# Patient Record
Sex: Male | Born: 1960 | Race: Black or African American | Hispanic: No | Marital: Married | Smoking: Never smoker
Health system: Southern US, Community
[De-identification: ages and names within clinical notes are randomized; demographics above are authoritative.]

## PROBLEM LIST (undated history)

## (undated) DIAGNOSIS — E559 Vitamin D deficiency, unspecified: Secondary | ICD-10-CM

## (undated) DIAGNOSIS — F32A Depression, unspecified: Secondary | ICD-10-CM

## (undated) DIAGNOSIS — M2550 Pain in unspecified joint: Secondary | ICD-10-CM

## (undated) DIAGNOSIS — E785 Hyperlipidemia, unspecified: Secondary | ICD-10-CM

## (undated) DIAGNOSIS — R12 Heartburn: Secondary | ICD-10-CM

## (undated) DIAGNOSIS — E119 Type 2 diabetes mellitus without complications: Secondary | ICD-10-CM

## (undated) DIAGNOSIS — K5900 Constipation, unspecified: Secondary | ICD-10-CM

## (undated) DIAGNOSIS — I10 Essential (primary) hypertension: Secondary | ICD-10-CM

## (undated) DIAGNOSIS — M549 Dorsalgia, unspecified: Secondary | ICD-10-CM

## (undated) DIAGNOSIS — K219 Gastro-esophageal reflux disease without esophagitis: Secondary | ICD-10-CM

## (undated) DIAGNOSIS — G4733 Obstructive sleep apnea (adult) (pediatric): Secondary | ICD-10-CM

## (undated) DIAGNOSIS — C61 Malignant neoplasm of prostate: Secondary | ICD-10-CM

## (undated) HISTORY — DX: Heartburn: R12

## (undated) HISTORY — DX: Hyperlipidemia, unspecified: E78.5

## (undated) HISTORY — DX: Type 2 diabetes mellitus without complications: E11.9

## (undated) HISTORY — DX: Vitamin D deficiency, unspecified: E55.9

## (undated) HISTORY — DX: Obstructive sleep apnea (adult) (pediatric): G47.33

## (undated) HISTORY — DX: Depression, unspecified: F32.A

## (undated) HISTORY — DX: Constipation, unspecified: K59.00

## (undated) HISTORY — PX: PROSTATE SURGERY: SHX751

## (undated) HISTORY — DX: Dorsalgia, unspecified: M54.9

## (undated) HISTORY — DX: Pain in unspecified joint: M25.50

---

## 1998-08-18 ENCOUNTER — Emergency Department (HOSPITAL_COMMUNITY): Admission: EM | Admit: 1998-08-18 | Discharge: 1998-08-18 | Payer: Self-pay | Admitting: Emergency Medicine

## 1998-08-18 ENCOUNTER — Encounter: Payer: Self-pay | Admitting: Emergency Medicine

## 2000-02-16 ENCOUNTER — Emergency Department (HOSPITAL_COMMUNITY): Admission: EM | Admit: 2000-02-16 | Discharge: 2000-02-16 | Payer: Self-pay | Admitting: Emergency Medicine

## 2003-05-16 ENCOUNTER — Emergency Department (HOSPITAL_COMMUNITY): Admission: EM | Admit: 2003-05-16 | Discharge: 2003-05-16 | Payer: Self-pay | Admitting: Emergency Medicine

## 2005-08-13 ENCOUNTER — Emergency Department (HOSPITAL_COMMUNITY): Admission: EM | Admit: 2005-08-13 | Discharge: 2005-08-13 | Payer: Self-pay | Admitting: Emergency Medicine

## 2009-01-02 ENCOUNTER — Emergency Department (HOSPITAL_COMMUNITY): Admission: EM | Admit: 2009-01-02 | Discharge: 2009-01-02 | Payer: Self-pay | Admitting: Emergency Medicine

## 2010-07-03 LAB — POCT I-STAT, CHEM 8
BUN: 15 mg/dL (ref 6–23)
Calcium, Ion: 1.15 mmol/L (ref 1.12–1.32)
Chloride: 105 mEq/L (ref 96–112)
Creatinine, Ser: 1.1 mg/dL (ref 0.4–1.5)
Glucose, Bld: 99 mg/dL (ref 70–99)
HCT: 35 % — ABNORMAL LOW (ref 39.0–52.0)
Hemoglobin: 11.9 g/dL — ABNORMAL LOW (ref 13.0–17.0)
Potassium: 4.6 mEq/L (ref 3.5–5.1)
Sodium: 139 mEq/L (ref 135–145)
TCO2: 24 mmol/L (ref 0–100)

## 2011-07-12 ENCOUNTER — Emergency Department (HOSPITAL_COMMUNITY): Payer: BC Managed Care – PPO

## 2011-07-12 ENCOUNTER — Encounter (HOSPITAL_COMMUNITY): Payer: Self-pay | Admitting: *Deleted

## 2011-07-12 ENCOUNTER — Emergency Department (HOSPITAL_COMMUNITY)
Admission: EM | Admit: 2011-07-12 | Discharge: 2011-07-13 | Disposition: A | Discharge status: Hospice | Payer: BC Managed Care – PPO | Attending: Emergency Medicine | Admitting: Emergency Medicine

## 2011-07-12 DIAGNOSIS — I10 Essential (primary) hypertension: Secondary | ICD-10-CM | POA: Insufficient documentation

## 2011-07-12 DIAGNOSIS — K219 Gastro-esophageal reflux disease without esophagitis: Secondary | ICD-10-CM | POA: Insufficient documentation

## 2011-07-12 DIAGNOSIS — J189 Pneumonia, unspecified organism: Secondary | ICD-10-CM | POA: Insufficient documentation

## 2011-07-12 DIAGNOSIS — R0789 Other chest pain: Secondary | ICD-10-CM | POA: Insufficient documentation

## 2011-07-12 DIAGNOSIS — R091 Pleurisy: Secondary | ICD-10-CM | POA: Insufficient documentation

## 2011-07-12 DIAGNOSIS — R0602 Shortness of breath: Secondary | ICD-10-CM | POA: Insufficient documentation

## 2011-07-12 HISTORY — DX: Essential (primary) hypertension: I10

## 2011-07-12 HISTORY — DX: Gastro-esophageal reflux disease without esophagitis: K21.9

## 2011-07-12 LAB — DIFFERENTIAL
Basophils Absolute: 0 10*3/uL (ref 0.0–0.1)
Basophils Relative: 0 % (ref 0–1)
Eosinophils Relative: 3 % (ref 0–5)
Monocytes Absolute: 0.7 10*3/uL (ref 0.1–1.0)

## 2011-07-12 LAB — CBC
Hemoglobin: 13.8 g/dL (ref 13.0–17.0)
MCH: 28.5 pg (ref 26.0–34.0)
MCHC: 35.5 g/dL (ref 30.0–36.0)
RDW: 13.4 % (ref 11.5–15.5)

## 2011-07-12 LAB — BASIC METABOLIC PANEL
BUN: 13 mg/dL (ref 6–23)
Calcium: 8.5 mg/dL (ref 8.4–10.5)
GFR calc Af Amer: 79 mL/min — ABNORMAL LOW (ref >90)
GFR calc non Af Amer: 69 mL/min — ABNORMAL LOW (ref >90)
Glucose, Bld: 139 mg/dL — ABNORMAL HIGH (ref 70–99)
Potassium: 3.6 mEq/L (ref 3.5–5.1)
Sodium: 136 mEq/L (ref 135–145)

## 2011-07-12 LAB — POCT I-STAT, CHEM 8
Creatinine, Ser: 1.1 mg/dL (ref 0.50–1.35)
Glucose, Bld: 144 mg/dL — ABNORMAL HIGH (ref 70–99)
HCT: 42 % (ref 39.0–52.0)
Hemoglobin: 14.3 g/dL (ref 13.0–17.0)
Sodium: 141 mEq/L (ref 135–145)
TCO2: 25 mmol/L (ref 0–100)

## 2011-07-12 NOTE — ED Notes (Signed)
Pt reports tightness to right chest for a few hours and constant. Pt reports history of the same in previous years and the pain passed. Pt denies N/V or dizziness. Pt reports shortness of breath at times.

## 2011-07-13 ENCOUNTER — Emergency Department (HOSPITAL_COMMUNITY): Payer: BC Managed Care – PPO

## 2011-07-13 MED ORDER — IOHEXOL 300 MG/ML  SOLN
100.0000 mL | Freq: Once | INTRAMUSCULAR | Status: AC | PRN
  Administered 2011-07-13: 100 mL via INTRAVENOUS

## 2011-07-13 MED ORDER — AZITHROMYCIN 250 MG PO TABS
500.0000 mg | ORAL_TABLET | Freq: Once | ORAL | Status: AC
  Administered 2011-07-13: 500 mg via ORAL
  Filled 2011-07-13: qty 2

## 2011-07-13 MED ORDER — IBUPROFEN 800 MG PO TABS: 800.0000 mg | ORAL_TABLET | Freq: Four times a day (QID) | ORAL | Status: AC | PRN

## 2011-07-13 MED ORDER — AZITHROMYCIN 250 MG PO TABS: ORAL_TABLET | ORAL | Status: AC

## 2011-07-13 MED ORDER — IBUPROFEN 800 MG PO TABS
800.0000 mg | ORAL_TABLET | Freq: Once | ORAL | Status: AC
  Administered 2011-07-13: 800 mg via ORAL
  Filled 2011-07-13: qty 1

## 2011-07-13 NOTE — Discharge Instructions (Signed)
Pleurisy Pleurisy is an inflammation and swelling of the lining of the lungs. It usually is the result of an underlying infection or other disease. Because of this inflammation, it hurts to breathe. It is aggravated by coughing or deep breathing. The primary goal in treating pleurisy is to diagnose and treat the condition that caused it.  HOME CARE INSTRUCTIONS   Only take over-the-counter or prescription medicines for pain, discomfort, or fever as directed by your caregiver.   If medications which kill germs (antibiotics) were prescribed, take the entire course. Even if you are feeling better, you need to take them.   Use a cool mist vaporizer to help loosen secretions. This is so the secretions can be coughed up more easily.  SEEK MEDICAL CARE IF:   Your pain is not controlled with medication or is increasing.   You have an increase inpus like (purulent) secretions brought up with coughing.  SEEK IMMEDIATE MEDICAL CARE IF:   You have blue or dark lips, fingernails, or toenails.   You begin coughing up blood.   You have increased difficulty breathing.   You have continuing pain unrelieved by medicine or lasting more than 1 week.   You have pain that radiates into your neck, arms, or jaw.   You develop increased shortness of breath or wheezing.   You develop a fever, rash, vomiting, fainting, or other serious complaints.  Document Released: 03/16/2005 Document Revised: 03/05/2011 Document Reviewed: 10/15/2006 [facility] Patient Information 2012 [place], [place].Pneumonia, Adult Pneumonia is an infection of the lungs.  CAUSES Pneumonia may be caused by bacteria or a virus. Usually, these infections are caused by breathing infectious particles into the lungs (respiratory tract). SYMPTOMS   Cough.   Fever.   Chest pain.   Increased rate of breathing.   Wheezing.   Mucus production.  DIAGNOSIS  If you have the common symptoms of pneumonia, your caregiver will typically  confirm the diagnosis with a chest X-ray. The X-ray will show an abnormality in the lung (pulmonary infiltrate) if you have pneumonia. Other tests of your blood, urine, or sputum may be done to find the specific cause of your pneumonia. Your caregiver may also do tests (blood gases or pulse oximetry) to see how well your lungs are working. TREATMENT  Some forms of pneumonia may be spread to other people when you cough or sneeze. You may be asked to wear a mask before and during your exam. Pneumonia that is caused by bacteria is treated with antibiotic medicine. Pneumonia that is caused by the influenza virus may be treated with an antiviral medicine. Most other viral infections must run their course. These infections will not respond to antibiotics.  PREVENTION A pneumococcal shot (vaccine) is available to prevent a common bacterial cause of pneumonia. This is usually suggested for:  People over 79 years old.   Patients on chemotherapy.   People with chronic lung problems, such as bronchitis or emphysema.   People with immune system problems.  If you are over 65 or have a high risk condition, you may receive the pneumococcal vaccine if you have not received it before. In some countries, a routine influenza vaccine is also recommended. This vaccine can help prevent some cases of pneumonia.You may be offered the influenza vaccine as part of your care. If you smoke, it is time to quit. You may receive instructions on how to stop smoking. Your caregiver can provide medicines and counseling to help you quit. HOME CARE INSTRUCTIONS   Cough suppressants  may be used if you are losing too much rest. However, coughing protects you by clearing your lungs. You should avoid using cough suppressants if you can.   Your caregiver may have prescribed medicine if he or she thinks your pneumonia is caused by a bacteria or influenza. Finish your medicine even if you start to feel better.   Your caregiver may also  prescribe an expectorant. This loosens the mucus to be coughed up.   Only take over-the-counter or prescription medicines for pain, discomfort, or fever as directed by your caregiver.   Do not smoke. Smoking is a common cause of bronchitis and can contribute to pneumonia. If you are a smoker and continue to smoke, your cough may last several weeks after your pneumonia has cleared.   A cold steam vaporizer or humidifier in your room or home may help loosen mucus.   Coughing is often worse at night. Sleeping in a semi-upright position in a recliner or using a couple pillows under your head will help with this.   Get rest as you feel it is needed. Your body will usually let you know when you need to rest.  SEEK IMMEDIATE MEDICAL CARE IF:   Your illness becomes worse. This is especially true if you are elderly or weakened from any other disease.   You cannot control your cough with suppressants and are losing sleep.   You begin coughing up blood.   You develop pain which is getting worse or is uncontrolled with medicines.   You have a fever.   Any of the symptoms which initially brought you in for treatment are getting worse rather than better.   You develop shortness of breath or chest pain.  MAKE SURE YOU:   Understand these instructions.   Will watch your condition.   Will get help right away if you are not doing well or get worse.  Document Released: 03/16/2005 Document Revised: 03/05/2011 Document Reviewed: 06/05/2010 [facility] Patient Information 2012 [place], [place].

## 2011-07-13 NOTE — ED Provider Notes (Signed)
History     CSN: [id number]  Arrival date & time 07/12/11  2152   First MD Initiated Contact with Patient 07/12/11 2234      Chief Complaint  Patient presents with  . Chest Pain  . Shortness of Breath    (Consider location/radiation/quality/duration/timing/severity/associated sxs/prior treatment) HPI Comments: Patient here with acute onset of right anterior chest pain for the past several hours - states that the pain is associated with taking in a deep breath where he feels a catch in the area - deneis fever, chills, cough, congestion, radiation of the pain, nausea, vomiting, shortness of breath, diaphoresis, calf pain or tenderness.  No history of clotting disorder and no cardiac risk factors besides being male and HTN, which is well controlled.  Patient is a 51 y.o. male presenting with chest pain and shortness of breath. The history is provided by the patient. No language interpreter was used.  Chest Pain The chest pain began 3 - 5 hours ago. Chest pain occurs constantly. The chest pain is unchanged. The pain is associated with breathing. At its most intense, the pain is at 5/10. The pain is currently at 5/10. The severity of the pain is moderate. The quality of the pain is described as sharp. The pain does not radiate. Chest pain is worsened by deep breathing. Primary symptoms include shortness of breath. Pertinent negatives for primary symptoms include no fever, no fatigue, no syncope, no cough, no wheezing, no palpitations, no abdominal pain, no nausea, no vomiting, no dizziness and no altered mental status.  Pertinent negatives for associated symptoms include no claudication, no diaphoresis, no lower extremity edema, no near-syncope, no numbness, no orthopnea, no paroxysmal nocturnal dyspnea and no weakness. He tried nothing for the symptoms. Risk factors include male gender.  His past medical history is significant for hypertension.  Pertinent negatives for past medical history include  no CAD, no diabetes and no hyperlipidemia.    Shortness of Breath  Associated symptoms include chest pain and shortness of breath. Pertinent negatives include no orthopnea, no fever, no cough and no wheezing.    Past Medical History  Diagnosis Date  . Hypertension   . Acid reflux     History reviewed. No pertinent past surgical history.  No family history on file.  History  Substance Use Topics  . Smoking status: Never Smoker   . Smokeless tobacco: Not on file  . Alcohol Use: No      Review of Systems  Constitutional: Negative for fever, diaphoresis and fatigue.  Respiratory: Positive for shortness of breath. Negative for cough and wheezing.   Cardiovascular: Positive for chest pain. Negative for palpitations, orthopnea, claudication, syncope and near-syncope.  Gastrointestinal: Negative for nausea, vomiting and abdominal pain.  Neurological: Negative for dizziness, weakness and numbness.  Psychiatric/Behavioral: Negative for altered mental status.  All other systems reviewed and are negative.    Allergies  Review of patient's allergies indicates no known allergies.  Home Medications   Current Outpatient Rx  Name Route Sig Dispense Refill  . DEXLANSOPRAZOLE 60 MG PO CPDR Oral Take 60 mg by mouth daily.    Marland Kitchen VALSARTAN 320 MG PO TABS Oral Take 320 mg by mouth daily.      BP 136/77  Pulse 69  Temp(Src) 98 F (36.7 C) (Oral)  Resp 16  Ht 5\' 7"  (1.702 m)  Wt 230 lb (104.327 kg)  BMI 36.02 kg/m2  SpO2 98%  Physical Exam  Nursing note and vitals reviewed. Constitutional: He is oriented  to person, place, and time. He appears well-developed and well-nourished. No distress.  HENT:  Head: Normocephalic and atraumatic.  Right Ear: External ear normal.  Left Ear: External ear normal.  Nose: Nose normal.  Mouth/Throat: Oropharynx is clear and moist. No oropharyngeal exudate.  Eyes: Conjunctivae are normal. Pupils are equal, round, and reactive to light. No scleral  icterus.  Neck: Normal range of motion. Neck supple. No JVD present. No tracheal deviation present.  Cardiovascular: Normal rate, regular rhythm and normal heart sounds.  Exam reveals no gallop and no friction rub.   No murmur heard. Pulmonary/Chest: Effort normal and breath sounds normal. No respiratory distress. He has no wheezes. He has no rales. He exhibits no tenderness.  Abdominal: Soft. Bowel sounds are normal. He exhibits no distension. There is no tenderness.  Musculoskeletal: Normal range of motion. He exhibits no edema and no tenderness.  Lymphadenopathy:    He has no cervical adenopathy.  Neurological: He is alert and oriented to person, place, and time. No cranial nerve deficit.  Skin: Skin is warm and dry. No rash noted. No erythema. No pallor.  Psychiatric: He has a normal mood and affect. His behavior is normal. Judgment and thought content normal.    ED Course  Procedures (including critical care time)  Labs Reviewed  CBC - Abnormal; Notable for the following:    HCT 38.9 (*)    All other components within normal limits  BASIC METABOLIC PANEL - Abnormal; Notable for the following:    Glucose, Bld 139 (*)    GFR calc non Af Amer 69 (*)    GFR calc Af Amer 79 (*)    All other components within normal limits  D-DIMER, QUANTITATIVE - Abnormal; Notable for the following:    D-Dimer, Quant 0.64 (*)    All other components within normal limits  POCT I-STAT, CHEM 8 - Abnormal; Notable for the following:    Glucose, Bld 144 (*)    All other components within normal limits  DIFFERENTIAL   Dg Chest 2 View  07/12/2011  *RADIOLOGY REPORT*  Clinical Data: Chest pain.  CHEST - 2 VIEW  Comparison: None.  Findings: The lungs are well-aerated.  Mild left basilar opacity may reflect atelectasis or pneumonia, though not well characterized on the lateral view.  There is no evidence of pleural effusion or pneumothorax.  The heart is normal in size; the mediastinal contour is within  normal limits.  No acute osseous abnormalities are seen.  IMPRESSION: Mild left basilar airspace opacity may reflect atelectasis or pneumonia.  Original Report Authenticated By: Tonia Ghent, M.D.   Ct Angio Chest W/cm &/or Wo Cm  07/13/2011  *RADIOLOGY REPORT*  Clinical Data: Right-sided chest tightness; shortness of breath. Elevated D-dimer.  CT ANGIOGRAPHY CHEST  Technique:  Multidetector CT imaging of the chest using the standard protocol during bolus administration of intravenous contrast. Multiplanar reconstructed images including MIPs were obtained and reviewed to evaluate the vascular anatomy.  Contrast: OMNIPAQUE IOHEXOL 300 MG/ML  SOLN  Comparison: Chest radiograph performed 07/12/2011  Findings: There is no evidence of central pulmonary embolus. Evaluation for pulmonary embolus is suboptimal due to limitations in the timing of the contrast bolus  A trace right-sided pleural effusion is noted.  Mild bilateral dependent subsegmental atelectasis is noted.  There is no evidence of significant focal consolidation or pneumothorax.  No masses are identified; no abnormal focal contrast enhancement is seen.  The mediastinum is unremarkable in appearance.  No mediastinal lymphadenopathy is seen.  No pericardial effusion is identified. The great vessels are unremarkable in appearance.  Incidental note is made of a direct origin of the left vertebral artery from the aortic arch.  No axillary lymphadenopathy is seen.  The visualized portions of the thyroid gland are unremarkable in appearance.  The visualized portions of the liver and spleen are unremarkable. The visualized portions of the pancreas, gallbladder, stomach and adrenal glands are within normal limits.  A fluid-filled structure inferior to the left hepatic lobe is thought to reflect the antrum of the stomach.  No acute osseous abnormalities are seen.  An apparent left-sided os acromiale is noted.  IMPRESSION:  1.  No evidence of central pulmonary  embolus. 2.  Trace right-sided pleural effusion, and mild bilateral dependent subsegmental atelectasis noted; lungs otherwise clear. 3.  Apparent left-sided os acromiale noted.  Original Report Authenticated By: Tonia Ghent, M.D.    Date: 07/13/2011  Rate: 72  Rhythm: normal sinus rhythm  QRS Axis: left  Intervals: normal  ST/T Wave abnormalities: normal  Conduction Disutrbances:none  Narrative Interpretation: Reviewed by Dr. Effie Shy  Old EKG Reviewed: unchanged   CAP  Pleurisy    MDM  Patient with right sided pleuritic chest pain with chest xray suspcious to CAP, will treat for such, trace right sided pleural effusion noted on CT scan which may be the cause of the patient's pain.  Will treat with anti-inflammatories and antibiotics.        Izola Price [place], [place] 07/13/11 [phone]

## 2011-07-16 NOTE — ED Provider Notes (Signed)
Medical screening examination/treatment/procedure(s) were performed by non-physician practitioner and as supervising physician I was immediately available for consultation/collaboration.  Kavita Bartl L Kinlie Janice, MD 07/16/11 0726 

## 2013-09-09 IMAGING — CT CT ANGIO CHEST
1 of 2 series · 19 of 32 positions shown · IV contrast (100 ML OMNI 300)
Comparison: Chest radiograph performed 07/12/2011

CLINICAL DATA: Right-sided chest tightness; shortness of breath.
Elevated D-dimer.

CT ANGIOGRAPHY CHEST
TECHNIQUE: Multidetector CT imaging of the chest using the
standard protocol during bolus administration of intravenous
contrast. Multiplanar reconstructed images including MIPs were
obtained and reviewed to evaluate the vascular anatomy.
Contrast: 100mL OMNIPAQUE IOHEXOL 300 MG/ML  SOLN

[Series 7: thins for pacs · axial · 0.74mm/px · z∈[+159,+367]mm · 19 of 232 slices shown]
[im 12/232  lung]
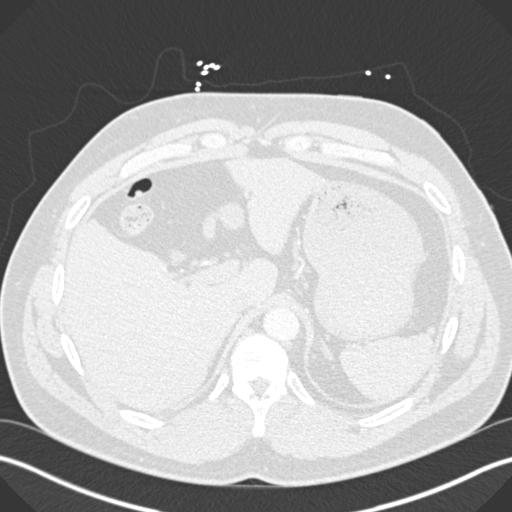
[im 24/232  mediastinal]
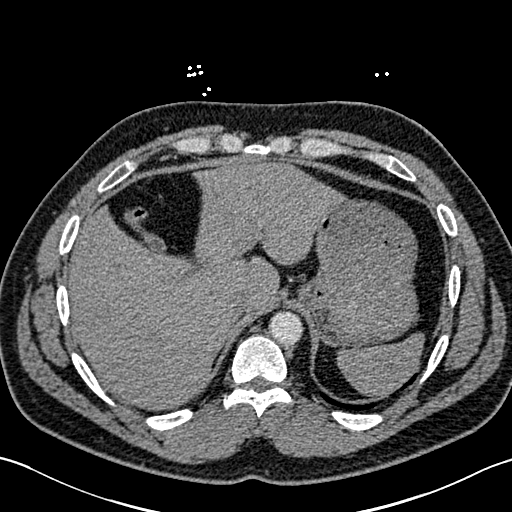
[im 35/232  lung]
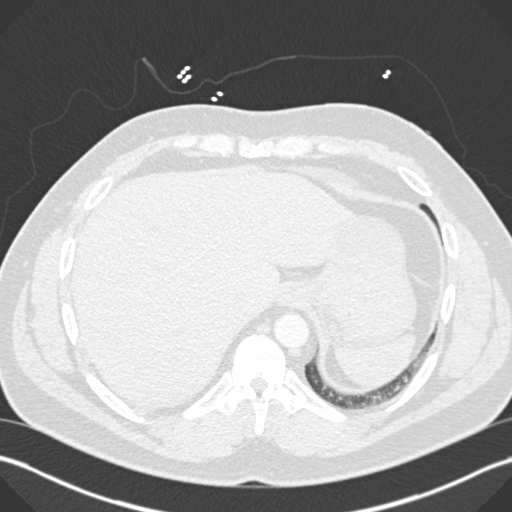
[im 58/232  mediastinal]
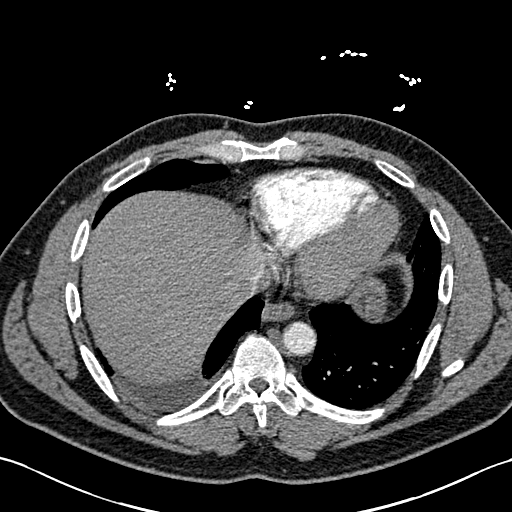
[im 70/232  lung]
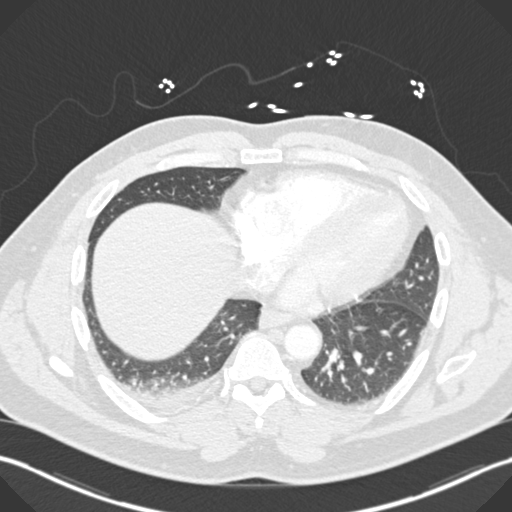
[im 78/232  mediastinal]
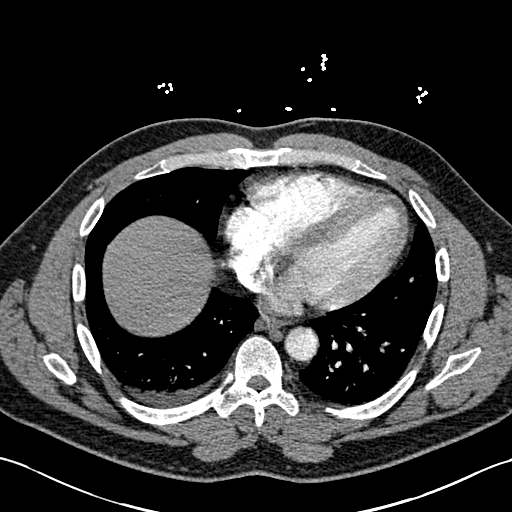
[im 81/232  lung]
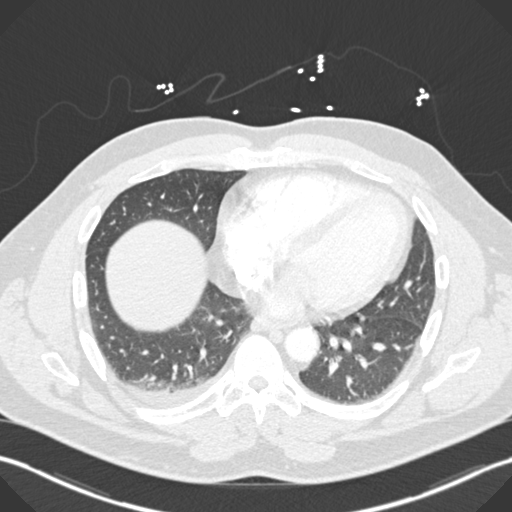
[im 93/232  mediastinal]
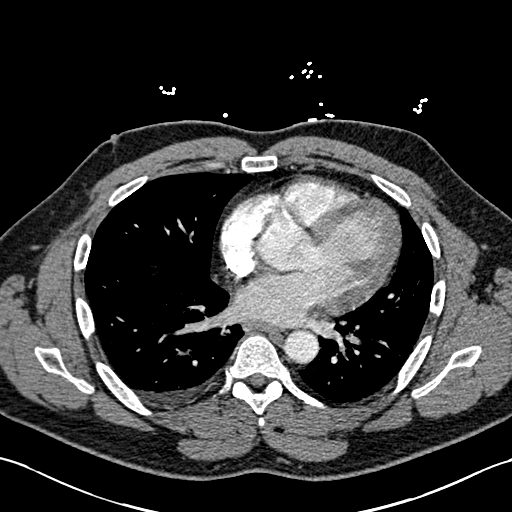
[im 104/232  lung]
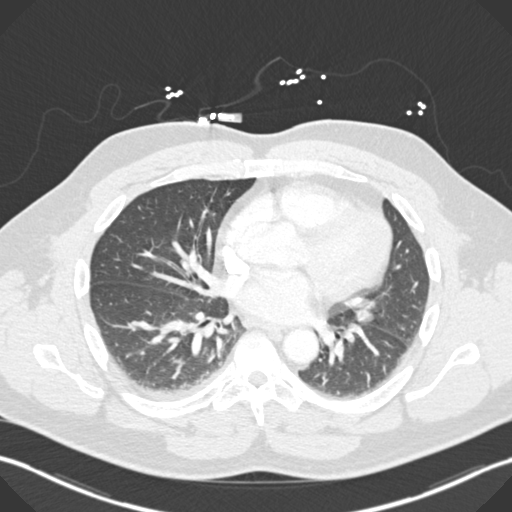
[im 116/232  mediastinal]
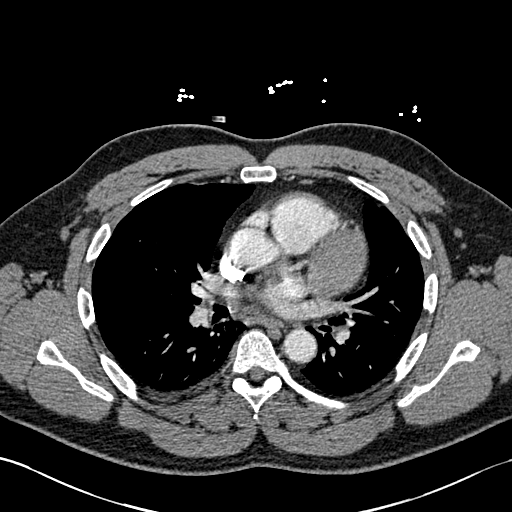
[im 128/232  lung]
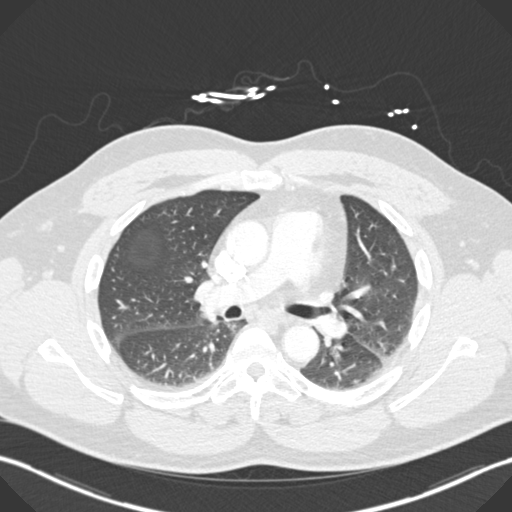
[im 139/232  mediastinal]
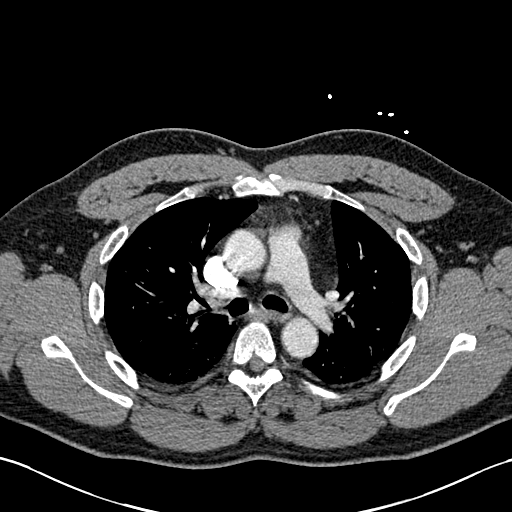
[im 151/232  lung]
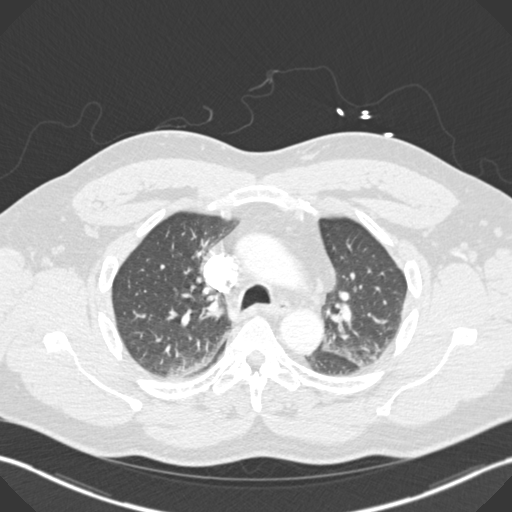
[im 155/232  mediastinal]
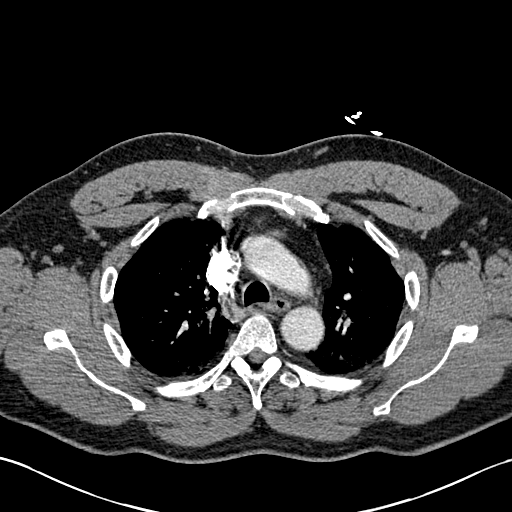
[im 162/232  lung]
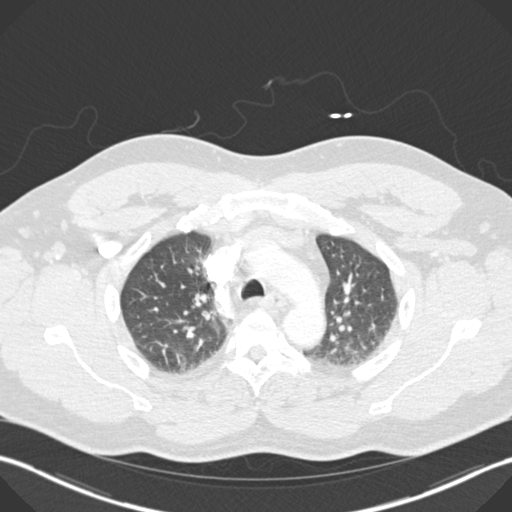
[im 174/232  mediastinal]
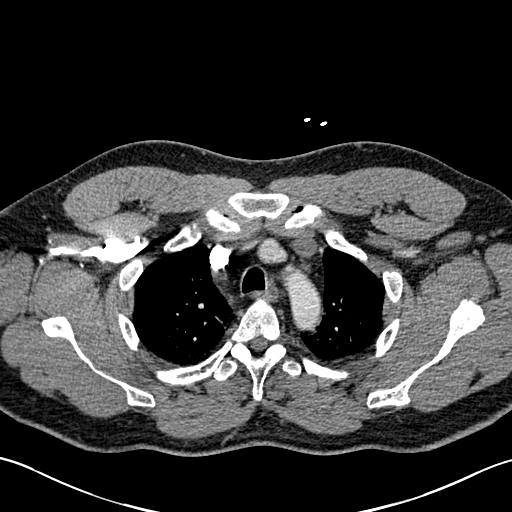
[im 197/232  lung]
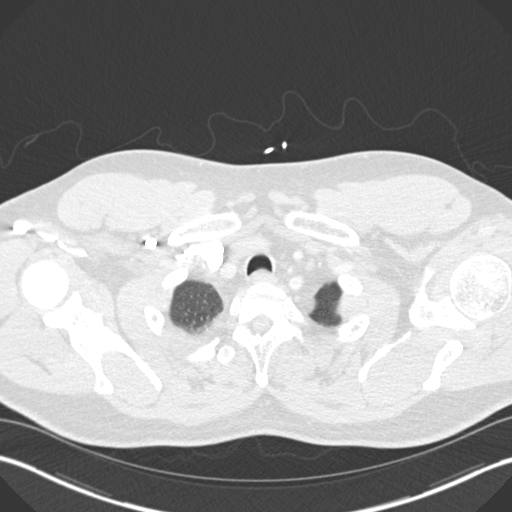
[im 208/232  mediastinal]
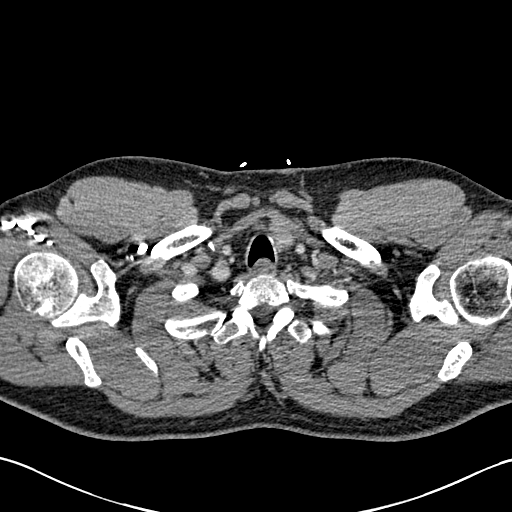
[im 220/232  lung]
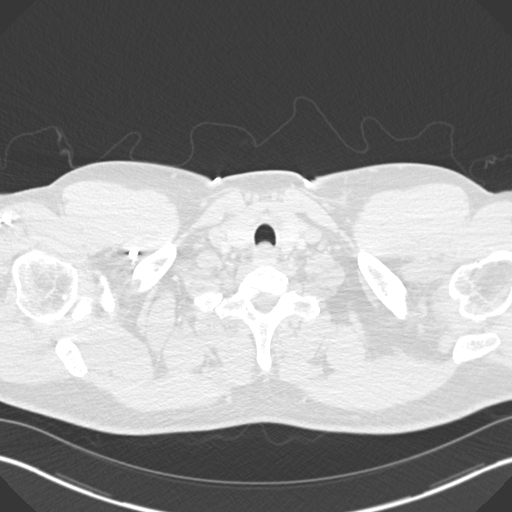

[19 of 32 positions shown; findings below may reference images not displayed]

FINDINGS: There is no evidence of central pulmonary embolus.
Evaluation for pulmonary embolus is suboptimal due to limitations
in the timing of the contrast bolus

A trace right-sided pleural effusion is noted.  Mild bilateral
dependent subsegmental atelectasis is noted.  There is no evidence
of significant focal consolidation or pneumothorax.  No masses are
identified; no abnormal focal contrast enhancement is seen.

The mediastinum is unremarkable in appearance.  No mediastinal
lymphadenopathy is seen.  No pericardial effusion is identified.
The great vessels are unremarkable in appearance.  Incidental note
is made of a direct origin of the left vertebral artery from the
aortic arch.  No axillary lymphadenopathy is seen.  The visualized
portions of the thyroid gland are unremarkable in appearance.

The visualized portions of the liver and spleen are unremarkable.
The visualized portions of the pancreas, gallbladder, stomach and
adrenal glands are within normal limits.  A fluid-filled structure
inferior to the left hepatic lobe is thought to reflect the antrum
of the stomach.

No acute osseous abnormalities are seen.  An apparent left-sided os
acromiale is noted.
IMPRESSION: 1.  No evidence of central pulmonary embolus.
2.  Trace right-sided pleural effusion, and mild bilateral
dependent subsegmental atelectasis noted; lungs otherwise clear.
3.  Apparent left-sided os acromiale noted.

## 2015-02-19 DIAGNOSIS — R7989 Other specified abnormal findings of blood chemistry: Secondary | ICD-10-CM | POA: Insufficient documentation

## 2018-03-09 DIAGNOSIS — E669 Obesity, unspecified: Secondary | ICD-10-CM | POA: Insufficient documentation

## 2018-03-15 ENCOUNTER — Ambulatory Visit: Payer: BC Managed Care – PPO

## 2018-03-15 ENCOUNTER — Encounter: Payer: BC Managed Care – PPO | Admitting: Podiatry

## 2018-03-15 DIAGNOSIS — M722 Plantar fascial fibromatosis: Secondary | ICD-10-CM

## 2018-03-15 NOTE — Progress Notes (Signed)
This encounter was created in error - please disregard.

## 2018-03-17 ENCOUNTER — Other Ambulatory Visit: Payer: Self-pay | Admitting: Podiatry

## 2018-03-17 ENCOUNTER — Encounter: Payer: Self-pay | Admitting: Podiatry

## 2018-03-17 ENCOUNTER — Ambulatory Visit: Payer: BC Managed Care – PPO | Admitting: Podiatry

## 2018-03-17 ENCOUNTER — Ambulatory Visit (INDEPENDENT_AMBULATORY_CARE_PROVIDER_SITE_OTHER): Payer: BC Managed Care – PPO

## 2018-03-17 VITALS — BP 182/110 | HR 72 | Resp 16

## 2018-03-17 DIAGNOSIS — M722 Plantar fascial fibromatosis: Secondary | ICD-10-CM

## 2018-03-17 DIAGNOSIS — M7750 Other enthesopathy of unspecified foot: Secondary | ICD-10-CM

## 2018-03-17 MED ORDER — METHYLPREDNISOLONE 4 MG PO TBPK: ORAL_TABLET | ORAL | 0 refills | Status: DC

## 2018-03-17 MED ORDER — MELOXICAM 15 MG PO TABS: 15.0000 mg | ORAL_TABLET | Freq: Every day | ORAL | 3 refills | Status: DC

## 2018-03-17 NOTE — Patient Instructions (Signed)

## 2018-03-19 NOTE — Progress Notes (Signed)
  Subjective:  Patient ID: Caleb Waller, male    DOB: 17-May-1960,  MRN: [id number] HPI Chief Complaint  Patient presents with  . Foot Pain    Plantar heel and arch bilateral - aching and needle-like sensations x years, intermittent severity of pain, AM pain, walking a lot and standing in one spot makes worse, tried soaking in epsom  . New Patient (Initial Visit)    57 y.o. male presents with the above complaint.   ROS: Denies fever chills nausea vomiting muscle aches pains calf pain back pain chest pain shortness of breath.  Past Medical History:  Diagnosis Date  . Acid reflux   . Hypertension    No past surgical history on file.  Current Outpatient Medications:  .  meloxicam (MOBIC) 15 MG tablet, Take 1 tablet (15 mg total) by mouth daily., Disp: 30 tablet, Rfl: 3 .  methylPREDNISolone (MEDROL DOSEPAK) 4 MG TBPK tablet, 6 day dose pack - take as directed, Disp: 21 tablet, Rfl: 0 .  valsartan (DIOVAN) 320 MG tablet, Take 320 mg by mouth daily., Disp: , Rfl:   No Known Allergies Review of Systems Objective:   Vitals:   03/17/18 1015 03/17/18 1016  BP: (!) 200/115 (!) 182/110  Pulse: 73 72  Resp: 16 16    General: Well developed, nourished, in no acute distress, alert and oriented x3   Dermatological: Skin is warm, dry and supple bilateral. Nails x 10 are well maintained; remaining integument appears unremarkable at this time. There are no open sores, no preulcerative lesions, no rash or signs of infection present.  Vascular: Dorsalis Pedis artery and Posterior Tibial artery pedal pulses are 2/4 bilateral with immedate capillary fill time. Pedal hair growth present. No varicosities and no lower extremity edema present bilateral.   Neruologic: Grossly intact via light touch bilateral. Vibratory intact via tuning fork bilateral. Protective threshold with Semmes Wienstein monofilament intact to all pedal sites bilateral. Patellar and Achilles deep tendon reflexes 2+ bilateral. No  Babinski or clonus noted bilateral.   Musculoskeletal: No gross boney pedal deformities bilateral. No pain, crepitus, or limitation noted with foot and ankle range of motion bilateral. Muscular strength 5/5 in all groups tested bilateral.  Pain on palpation bilateral heels.  No pain on medial and lateral compression of the calcaneus.  Gait: Unassisted, Nonantalgic.    Radiographs:  Radiographs taken today demonstrate soft tissue increase in density plantar calcaneal insertion site.  Other than that no acute findings.  Assessment & Plan:   Assessment: Plantar fasciitis bilateral.  Plan: Discussed etiology pathology conservative or surgical therapies.  At this point performed an injection to the medial aspect of the bilateral heel after sterile Betadine skin prep with 20 mg Kenalog 5 mg Marcaine point maximal tenderness.  Tolerated procedure well without complications.  Discussed appropriate shoe gear stretching exercise ice therapy and sugar modifications placed plantar fascial splints bilaterally and a single night splint.  He is also advised to be casted for orthotics which he was today.  And I will follow-up with him in the near future.  Also started him on a Medrol Dosepak to be followed by meloxicam.     Max T. [place], [place]

## 2018-04-14 ENCOUNTER — Ambulatory Visit: Payer: BC Managed Care – PPO | Admitting: Podiatry

## 2018-04-19 ENCOUNTER — Ambulatory Visit: Payer: BC Managed Care – PPO | Admitting: Podiatry

## 2019-06-08 ENCOUNTER — Ambulatory Visit: Payer: BC Managed Care – PPO | Attending: Family

## 2019-06-08 DIAGNOSIS — Z23 Encounter for immunization: Secondary | ICD-10-CM

## 2019-06-08 NOTE — Progress Notes (Signed)
   Covid-19 Vaccination Clinic  Name:  Caleb Waller    MRN: [id number] DOB: 1961/03/24  06/08/2019  Caleb Waller was observed post Covid-19 immunization for 15 minutes without incident. He was provided with Vaccine Information Sheet and instruction to access the V-Safe system.   Caleb Waller was instructed to call 911 with any severe reactions post vaccine: Marland Kitchen Difficulty breathing  . Swelling of face and throat  . A fast heartbeat  . A bad rash all over body  . Dizziness and weakness   Immunizations Administered    Name Date Dose VIS Date Route   Moderna COVID-19 Vaccine 06/08/2019 11:19 AM 0.5 mL 02/28/2019 Intramuscular   Manufacturer: Moderna   Lot: [id number]   [id number]

## 2019-07-11 ENCOUNTER — Ambulatory Visit: Payer: BC Managed Care – PPO | Attending: Family

## 2019-07-11 DIAGNOSIS — Z23 Encounter for immunization: Secondary | ICD-10-CM

## 2019-07-11 NOTE — Progress Notes (Signed)
   Covid-19 Vaccination Clinic  Name:  King Parrado    MRN: [id number] DOB: 12-13-1960  07/11/2019  Mr. Teems was observed post Covid-19 immunization for 15 minutes without incident. He was provided with Vaccine Information Sheet and instruction to access the V-Safe system.   Mr. Toomes was instructed to call 911 with any severe reactions post vaccine: Marland Kitchen Difficulty breathing  . Swelling of face and throat  . A fast heartbeat  . A bad rash all over body  . Dizziness and weakness   Immunizations Administered    Name Date Dose VIS Date Route   Moderna COVID-19 Vaccine 07/11/2019 10:31 AM 0.5 mL 02/28/2019 Intramuscular   Manufacturer: Moderna   Lot: [id number]   [id number]

## 2019-12-05 ENCOUNTER — Telehealth: Payer: Self-pay | Admitting: *Deleted

## 2019-12-05 NOTE — Telephone Encounter (Signed)
LVM for call back to schedule appointment with Dr. Manning. 

## 2020-01-01 NOTE — Progress Notes (Signed)
GU Location of Tumor / Histology: prostatic adenocarcinoma  If Prostate Cancer, Gleason Score is (4 + 3) and PSA is (4.73). Prostate volume: 33g.  Caleb Waller presented to Dr. Gloriann Loan with a single elevated PSA value of 4.9 from 07/27/19.  Biopsies of prostate (if applicable) revealed:    Past/Anticipated interventions by urology, if any: prostate biopsy, referral to Dr. Tammi Klippel to discuss radiotherapeutic options  Past/Anticipated interventions by medical oncology, if any: no  Weight changes, if any: no  Bowel/Bladder complaints, if any: IPSS 4. SHIM 19. Denies dysuria, hematuria, or urinary leakage.Denies any bowel complaints.    Nausea/Vomiting, if any: no  Pain issues, if any:  no  SAFETY ISSUES:  Prior radiation? no  Pacemaker/ICD? no  Possible current pregnancy? no, male patient  Is the patient on methotrexate? no  Current Complaints / other details:  59 year old male. Resides in [place]. Married with 2 daughters and 3 sons.   Prostatectomy vs XRT

## 2020-01-02 ENCOUNTER — Ambulatory Visit
Admission: RE | Admit: 2020-01-02 | Discharge: 2020-01-02 | Disposition: A | Discharge status: Home / Self Care | Payer: BC Managed Care – PPO | Source: Ambulatory Visit | Attending: Radiation Oncology | Admitting: Radiation Oncology

## 2020-01-02 ENCOUNTER — Encounter: Payer: Self-pay | Admitting: Radiation Oncology

## 2020-01-02 ENCOUNTER — Other Ambulatory Visit: Payer: Self-pay

## 2020-01-02 ENCOUNTER — Encounter: Payer: Self-pay | Admitting: Licensed Clinical Social Worker

## 2020-01-02 VITALS — BP 149/94 | HR 81 | Temp 97.2°F | Resp 18 | Ht 67.0 in | Wt 248.6 lb

## 2020-01-02 DIAGNOSIS — Z803 Family history of malignant neoplasm of breast: Secondary | ICD-10-CM | POA: Diagnosis not present

## 2020-01-02 DIAGNOSIS — K219 Gastro-esophageal reflux disease without esophagitis: Secondary | ICD-10-CM | POA: Diagnosis not present

## 2020-01-02 DIAGNOSIS — I10 Essential (primary) hypertension: Secondary | ICD-10-CM | POA: Insufficient documentation

## 2020-01-02 DIAGNOSIS — C61 Malignant neoplasm of prostate: Secondary | ICD-10-CM | POA: Diagnosis present

## 2020-01-02 DIAGNOSIS — Z79899 Other long term (current) drug therapy: Secondary | ICD-10-CM | POA: Insufficient documentation

## 2020-01-02 DIAGNOSIS — E785 Hyperlipidemia, unspecified: Secondary | ICD-10-CM | POA: Insufficient documentation

## 2020-01-02 DIAGNOSIS — R7309 Other abnormal glucose: Secondary | ICD-10-CM | POA: Insufficient documentation

## 2020-01-02 DIAGNOSIS — E559 Vitamin D deficiency, unspecified: Secondary | ICD-10-CM | POA: Insufficient documentation

## 2020-01-02 HISTORY — DX: Malignant neoplasm of prostate: C61

## 2020-01-02 NOTE — Progress Notes (Signed)
[facility] Psychosocial Distress Screening Clinical Social Work  Clinical Social Work was referred by distress screening protocol.  The patient scored a 6 on the Psychosocial Distress Thermometer which indicates moderate distress.   Clinical Social Worker attempted to contact patient by phone. No answer, left VM with direct contact information.  ONCBCN DISTRESS SCREENING 01/02/2020  Screening Type Initial Screening  Distress experienced in past week (1-10) 6  Practical problem type Work/school  Physician notified of physical symptoms Yes  Referral to clinical psychology No  Referral to clinical social work No  Referral to dietition No  Referral to financial advocate No  Referral to support programs Yes  Referral to palliative care No       Schelly Chuba E, LCSW

## 2020-01-02 NOTE — Progress Notes (Signed)
Radiation Oncology         [phone]) [phone] ________________________________  Initial outpatient Consultation  Name: Caleb Waller MRN: [id number]  Date: 01/02/2020  DOB: Mar 17, 1961  DP:OEUMPNT, No Pcp Per  Caleb Mallow, MD   REFERRING PHYSICIAN: Lucas Mallow, MD  DIAGNOSIS: 59 y.o. gentleman with Stage T1c adenocarcinoma of the prostate with Gleason score of 4+3, and PSA of 4.73.    ICD-10-CM   1. Malignant neoplasm of prostate ([place])  C61 Ambulatory referral to Social Work    HISTORY OF PRESENT ILLNESS: Caleb Waller is a 59 y.o. male with a diagnosis of prostate cancer. He was noted to have an elevated PSA of 4.9 by his primary care physician, Dr. Karlton Waller.  Accordingly, he was referred for evaluation in urology by Dr. Gloriann Waller on 09/20/2019,  digital rectal examination was performed at that time revealing no nodules.  Repeat PSA performed that day was stable but remained elevated at 4.73.  The patient proceeded to transrectal ultrasound with 12 biopsies of the prostate on 11/23/2019.  The prostate volume measured 33 cc.  Out of 12 core biopsies, 3 were positive.  The maximum Gleason score was 4+3, and this was seen in the left apex.  Additionally, Gleason 3+4 was seen in the left mid and left apex lateral, both of which also showed perineural invasion.  The patient reviewed the biopsy results with his urologist and he has kindly been referred today for discussion of potential radiation treatment options.   PREVIOUS RADIATION THERAPY: No  PAST MEDICAL HISTORY:  Past Medical History:  Diagnosis Date  . Acid reflux   . Hypertension   . Prostate cancer ([place])       PAST SURGICAL HISTORY:History reviewed. No pertinent surgical history.  FAMILY HISTORY:  Family History  Problem Relation Age of Onset  . Breast cancer Cousin     SOCIAL HISTORY: He works full-time as a Pharmacist, community at [employer]. Social History   Socioeconomic History  . Marital status: Significant  Other    Spouse name: Caleb Waller  . Number of children: 5  . Years of education: Not on file  . Highest education level: Not on file  Occupational History    Comment: Pharmacist, community  Tobacco Use  . Smoking status: Never Smoker  . Smokeless tobacco: Never Used  Substance and Sexual Activity  . Alcohol use: No  . Drug use: No  . Sexual activity: Yes  Other Topics Concern  . Not on file  Social History Narrative  . Not on file   Social Determinants of Health   Financial Resource Strain:   . Difficulty of Paying Living Expenses: Not on file  Food Insecurity:   . Worried About Charity fundraiser in the Last Year: Not on file  . Ran Out of Food in the Last Year: Not on file  Transportation Needs:   . Lack of Transportation (Medical): Not on file  . Lack of Transportation (Non-Medical): Not on file  Physical Activity:   . Days of Exercise per Week: Not on file  . Minutes of Exercise per Session: Not on file  Stress:   . Feeling of Stress : Not on file  Social Connections:   . Frequency of Communication with Friends and Family: Not on file  . Frequency of Social Gatherings with Friends and Family: Not on file  . Attends Religious Services: Not on file  . Active Member of Clubs or Organizations: Not on file  .  Attends Archivist Meetings: Not on file  . Marital Status: Not on file  Intimate Partner Violence:   . Fear of Current or Ex-Partner: Not on file  . Emotionally Abused: Not on file  . Physically Abused: Not on file  . Sexually Abused: Not on file    ALLERGIES: Patient has no known allergies.  MEDICATIONS:  Current Outpatient Medications  Medication Sig Dispense Refill  . amLODipine (NORVASC) 5 MG tablet Take 5 mg by mouth daily.    Marland Kitchen omeprazole (PRILOSEC) 40 MG capsule Take 40 mg by mouth daily.    . valsartan (DIOVAN) 320 MG tablet Take 320 mg by mouth daily.    . Vitamin D, Ergocalciferol, (DRISDOL) 1.25 MG (50000 UNIT) CAPS capsule Take 50,000  Units by mouth once a week.     No current facility-administered medications for this encounter.    REVIEW OF SYSTEMS:  On review of systems, the patient reports that he is doing well overall. He denies any chest pain, shortness of breath, cough, fevers, chills, night sweats, unintended weight changes. He denies any bowel disturbances, and denies abdominal pain, nausea or vomiting. He denies any new musculoskeletal or joint aches or pains. His IPSS was 4, indicating mile urinary symptoms. His SHIM was 19, indicating he has mild erectile dysfunction. A complete review of systems is obtained and is otherwise negative.    PHYSICAL EXAM:  Wt Readings from Last 3 Encounters:  01/02/20 248 lb 9.6 oz (112.8 kg)  07/12/11 230 lb (104.3 kg)   Temp Readings from Last 3 Encounters:  01/02/20 (!) 97.2 F (36.2 C)  07/12/11 98 F (36.7 C) (Oral)   BP Readings from Last 3 Encounters:  01/02/20 (!) 149/94  03/17/18 (!) 182/110  07/13/11 145/85   Pulse Readings from Last 3 Encounters:  01/02/20 81  03/17/18 72  07/13/11 81   Pain Assessment Pain Score: 0-No pain/10  In general this is a well appearing African American male in no acute distress. He is alert and oriented x4 and appropriate throughout the examination. HEENT reveals that the patient is normocephalic, atraumatic. EOMs are intact. PERRLA. Skin is intact without any evidence of gross lesions. Cardiopulmonary assessment is negative for acute distress and he exhibits normal effort. The abdomen is soft, non tender, non distended. Lower extremities are negative for pretibial pitting edema, deep calf tenderness, cyanosis or clubbing.   KPS = 100  100 - Normal; no complaints; no evidence of disease. 90   - Able to carry on normal activity; minor signs or symptoms of disease. 80   - Normal activity with effort; some signs or symptoms of disease. 28   - Cares for self; unable to carry on normal activity or to do active work. 60   -  Requires occasional assistance, but is able to care for most of his personal needs. 50   - Requires considerable assistance and frequent medical care. 82   - Disabled; requires special care and assistance. 25   - Severely disabled; hospital admission is indicated although death not imminent. 24   - Very sick; hospital admission necessary; active supportive treatment necessary. 10   - Moribund; fatal processes progressing rapidly. 0     - Dead  Karnofsky DA, Abelmann WH, Craver LS and Burchenal [facility] [phone]) The use of the nitrogen mustards in the palliative treatment of carcinoma: with particular reference to bronchogenic carcinoma Cancer 1 634-56  LABORATORY DATA:  Lab Results  Component Value Date   WBC 7.8  07/12/2011   HGB 14.3 07/12/2011   HCT 42.0 07/12/2011   MCV 80.2 07/12/2011   PLT 198 07/12/2011   Lab Results  Component Value Date   NA 141 07/12/2011   K 3.6 07/12/2011   CL 105 07/12/2011   CO2 25 07/12/2011   No results found for: ALT, AST, GGT, ALKPHOS, BILITOT   RADIOGRAPHY: No results found.    IMPRESSION/PLAN: 1. 59 y.o. gentleman with Stage T1c adenocarcinoma of the prostate with Gleason Score of 4+3, and PSA of 4.73. We discussed the patient's workup and outlined the nature of prostate cancer in this setting. The patient's T stage, Gleason's score, and PSA put him into the intermediate risk group. Accordingly, he is eligible for a variety of potential treatment options including brachytherapy, 5.5 weeks of external radiation or prostatectomy. We discussed the available radiation techniques, and focused on the details and logistics of delivery. We discussed and outlined the risks, benefits, short and long-term effects associated with radiotherapy and compared and contrasted these with prostatectomy. We discussed the role of SpaceOAR gel in reducing the rectal toxicity associated with radiotherapy.  He appears to have a good understanding of his disease and our treatment  recommendations which are of curative intent.  He was encouraged to ask questions that were answered to his stated satisfaction.  At the conclusion of our conversation, the patient is interested in moving forward with 5.5 weeks of external beam therapy. We will share our discussion with Dr. Gloriann Waller and make arrangements for fiducial markers and SpaceOAR gel placement, first available with Dr. Gloriann Waller, prior to simulation, to reduce rectal toxicity from radiotherapy.  He has an upcoming follow-up appointment with Dr. Gloriann Waller on 01/04/2020 to further discuss his treatment decision.  Pending Dr. Gloriann Waller is in agreement with the stated plan, we will move forward with treatment planning accordingly, in anticipation of beginning IMRT in the near future.    Nicholos Johns, PA-C    Tyler Pita, MD  [facility] Direct Dial: [phone]  Fax: [phone] [email or web address]  Skype  LinkedIn   This document serves as a record of services personally performed by Tyler Pita, MD and Freeman Caldron, PA-C. It was created on their behalf by Wilburn Mylar, a trained medical scribe. The creation of this record is based on the scribe's personal observations and the provider's statements to them. This document has been checked and approved by the attending provider.

## 2020-01-04 NOTE — Progress Notes (Signed)
[place] Clinical Social Work   CSW attempted to contact patient again regarding distress screen. No answer. Left VM with contact information.  CSW made be re-consulted again as needed in the future.    Edwinna Areola Gala Padovano, LCSW

## 2020-01-10 ENCOUNTER — Telehealth: Payer: Self-pay | Admitting: Radiation Oncology

## 2020-01-10 NOTE — Telephone Encounter (Signed)
-----   Message from Freeman Caldron, [place] sent at 01/10/2020  1:36 PM EDT ----- Regarding: FW: PHONE CALL Aldona Bar, Please reach out to this patient to see if you can triage his questions/concerns and let me know if I need to get involved. Thank you! Enid Derry, As a reminder, please direct all patient calls to [place], [place], unless there is documentation that I have left a message and am awaiting their return call. Patient calls should always be triaged with nursing first, since 90%+ of the time, nursing can address their questions/concerns quicker than I can and we can take care of our patients more efficiently this way. Thank you! -Ashlyn ----- Message ----- From: Kerri Perches Sent: 01/10/2020   1:10 PM EDT To: Freeman Caldron, PA-C Subject: PHONE CALL                                     Hi Ashlyn,   This patient left a message for you to give him a call.  Caleb Waller phone number is [phone].  Thanks,  [employer]

## 2020-01-10 NOTE — Telephone Encounter (Signed)
Phoned patient to inquire about need as requested by Ashlyn Bruning, PA-C. Patient explains he had a follow up appointment with Dr. Gloriann Loan on 01/04/2020 but was actually seen by Dr. Louis Meckel. The patient goes onto explain that Dr. Louis Meckel told him someone from our office would call with next steps but he hasn't heard from anyone. Explained to the patient that fiducial marker and spaceoar placement will be next in his treatment regimen. Transferred patient to Romie Jumper for an update on these scheduling arrangements.

## 2020-01-17 ENCOUNTER — Telehealth: Payer: Self-pay | Admitting: *Deleted

## 2020-01-17 NOTE — Telephone Encounter (Signed)
Returned patient's phone call, spoke with patient 

## 2020-01-18 ENCOUNTER — Encounter: Payer: Self-pay | Admitting: Medical Oncology

## 2020-01-18 NOTE — Progress Notes (Signed)
Left message to introduce myself as the prostate nurse navigator and requested a return call.  I left my contact information and asked him to call to discuss his treatment plan and discuss any barriers to care.

## 2020-01-19 ENCOUNTER — Encounter: Payer: Self-pay | Admitting: Medical Oncology

## 2020-01-19 NOTE — Progress Notes (Signed)
Discussed my role as the prostate nurse navigator. He has chosen 5.5 weeks of radiation and would like to move forward with getting it scheduled. I explained that the next step is having the gold markers placed with SpaceOar and this will be done @ [facility]. I will follow up with Enid Derry on this appointment and call him back with an update. He voiced understanding.

## 2020-01-22 ENCOUNTER — Telehealth: Payer: Self-pay | Admitting: *Deleted

## 2020-01-22 ENCOUNTER — Encounter: Payer: Self-pay | Admitting: Medical Oncology

## 2020-01-22 NOTE — Progress Notes (Signed)
Patient is scheduled for gold markers and SpaceOar gel 11/4 @ [facility]. I spoke with him to confirm this appointment. He states he did receive a call from [facility] with appointment. I informed him, Enid Derry will call him to schedule the CT simulation. I asked him to call me if he has questions or concerns. He voiced understanding.

## 2020-01-22 NOTE — Telephone Encounter (Signed)
Called patient to inform of fid. marker and space oar placement on 02-01-20 @ [facility] and his sim on 02-06-20 - arrival time - 10:15 am @ [facility], spoke with patient and he is aware of these appts.

## 2020-01-25 ENCOUNTER — Other Ambulatory Visit: Payer: Self-pay | Admitting: Urology

## 2020-01-25 DIAGNOSIS — C61 Malignant neoplasm of prostate: Secondary | ICD-10-CM

## 2020-02-02 ENCOUNTER — Telehealth: Payer: Self-pay | Admitting: *Deleted

## 2020-02-02 NOTE — Telephone Encounter (Signed)
CALLED PATIENT TO REMIND OF APPTS. FOR 02-06-20, LVM, FOR A RETURN CALL

## 2020-02-05 ENCOUNTER — Telehealth: Payer: Self-pay | Admitting: *Deleted

## 2020-02-05 NOTE — Telephone Encounter (Signed)
CALLED PATIENT TO REMIND OF SIM AND MRI FOR 02-06-20, SPOKE WITH PATIENT AND HE IS AWARE OF THESE APPTS.

## 2020-02-06 ENCOUNTER — Ambulatory Visit
Admission: RE | Admit: 2020-02-06 | Discharge: 2020-02-06 | Disposition: A | Discharge status: Home / Self Care | Payer: BC Managed Care – PPO | Source: Ambulatory Visit | Attending: Radiation Oncology | Admitting: Radiation Oncology

## 2020-02-06 ENCOUNTER — Encounter: Payer: Self-pay | Admitting: Medical Oncology

## 2020-02-06 ENCOUNTER — Other Ambulatory Visit: Payer: Self-pay

## 2020-02-06 ENCOUNTER — Ambulatory Visit (HOSPITAL_COMMUNITY)
Admission: RE | Admit: 2020-02-06 | Discharge: 2020-02-06 | Disposition: A | Discharge status: Home / Self Care | Payer: BC Managed Care – PPO | Source: Ambulatory Visit | Attending: Urology | Admitting: Urology

## 2020-02-06 DIAGNOSIS — Z510 Encounter for antineoplastic radiation therapy: Secondary | ICD-10-CM | POA: Diagnosis not present

## 2020-02-06 DIAGNOSIS — C61 Malignant neoplasm of prostate: Secondary | ICD-10-CM | POA: Insufficient documentation

## 2020-02-06 NOTE — Progress Notes (Signed)
  Radiation Oncology         [phone] ________________________________  Name: Treven Holtman MRN: [id number]  Date: 02/06/2020  DOB: 1960/12/05  SIMULATION AND TREATMENT PLANNING NOTE    ICD-10-CM   1. Malignant neoplasm of prostate ([place])  C61     DIAGNOSIS:  59 y.o. gentleman with Stage T1c adenocarcinoma of the prostate with Gleason score of 4+3, and PSA of 4.73  NARRATIVE:  The patient was brought to the [place].  Identity was confirmed.  All relevant records and images related to the planned course of therapy were reviewed.  The patient freely provided informed written consent to proceed with treatment after reviewing the details related to the planned course of therapy. The consent form was witnessed and verified by the simulation staff.  Then, the patient was set-up in a stable reproducible supine position for radiation therapy.  A vacuum lock pillow device was custom fabricated to position his legs in a reproducible immobilized position.  Then, I performed a urethrogram under sterile conditions to identify the prostatic apex.  CT images were obtained.  Surface markings were placed.  The CT images were loaded into the planning software.  Then the prostate target and avoidance structures including the rectum, bladder, bowel and hips were contoured.  Treatment planning then occurred.  The radiation prescription was entered and confirmed.  A total of one complex treatment devices was fabricated. I have requested : Intensity Modulated Radiotherapy (IMRT) is medically necessary for this case for the following reason:  Rectal sparing.Marland Kitchen  PLAN:  The patient will receive 70 Gy in 28 fractions.  ________________________________  Sheral Apley Tammi Klippel, M.D.

## 2020-02-12 DIAGNOSIS — Z510 Encounter for antineoplastic radiation therapy: Secondary | ICD-10-CM | POA: Diagnosis not present

## 2020-02-15 ENCOUNTER — Ambulatory Visit
Admission: RE | Admit: 2020-02-15 | Discharge: 2020-02-15 | Disposition: A | Discharge status: Home / Self Care | Payer: BC Managed Care – PPO | Source: Ambulatory Visit | Attending: Radiation Oncology | Admitting: Radiation Oncology

## 2020-02-15 ENCOUNTER — Encounter: Payer: Self-pay | Admitting: Medical Oncology

## 2020-02-15 DIAGNOSIS — Z510 Encounter for antineoplastic radiation therapy: Secondary | ICD-10-CM | POA: Diagnosis not present

## 2020-02-16 ENCOUNTER — Ambulatory Visit
Admission: RE | Admit: 2020-02-16 | Discharge: 2020-02-16 | Disposition: A | Discharge status: Home / Self Care | Payer: BC Managed Care – PPO | Source: Ambulatory Visit | Attending: Radiation Oncology | Admitting: Radiation Oncology

## 2020-02-16 DIAGNOSIS — Z510 Encounter for antineoplastic radiation therapy: Secondary | ICD-10-CM | POA: Diagnosis not present

## 2020-02-18 ENCOUNTER — Other Ambulatory Visit: Payer: Self-pay

## 2020-02-18 ENCOUNTER — Ambulatory Visit
Admission: RE | Admit: 2020-02-18 | Discharge: 2020-02-18 | Disposition: A | Discharge status: Home / Self Care | Payer: BC Managed Care – PPO | Source: Ambulatory Visit | Attending: Radiation Oncology | Admitting: Radiation Oncology

## 2020-02-18 DIAGNOSIS — Z510 Encounter for antineoplastic radiation therapy: Secondary | ICD-10-CM | POA: Diagnosis not present

## 2020-02-19 ENCOUNTER — Ambulatory Visit
Admission: RE | Admit: 2020-02-19 | Discharge: 2020-02-19 | Disposition: A | Discharge status: Home / Self Care | Payer: BC Managed Care – PPO | Source: Ambulatory Visit | Attending: Radiation Oncology | Admitting: Radiation Oncology

## 2020-02-19 ENCOUNTER — Other Ambulatory Visit: Payer: Self-pay

## 2020-02-19 DIAGNOSIS — Z510 Encounter for antineoplastic radiation therapy: Secondary | ICD-10-CM | POA: Diagnosis not present

## 2020-02-20 ENCOUNTER — Ambulatory Visit
Admission: RE | Admit: 2020-02-20 | Discharge: 2020-02-20 | Disposition: A | Discharge status: Home / Self Care | Payer: BC Managed Care – PPO | Source: Ambulatory Visit | Attending: Radiation Oncology | Admitting: Radiation Oncology

## 2020-02-20 DIAGNOSIS — Z510 Encounter for antineoplastic radiation therapy: Secondary | ICD-10-CM | POA: Diagnosis not present

## 2020-02-21 ENCOUNTER — Ambulatory Visit
Admission: RE | Admit: 2020-02-21 | Discharge: 2020-02-21 | Disposition: A | Discharge status: Home / Self Care | Payer: BC Managed Care – PPO | Source: Ambulatory Visit | Attending: Radiation Oncology | Admitting: Radiation Oncology

## 2020-02-21 DIAGNOSIS — Z510 Encounter for antineoplastic radiation therapy: Secondary | ICD-10-CM | POA: Diagnosis not present

## 2020-02-26 ENCOUNTER — Ambulatory Visit
Admission: RE | Admit: 2020-02-26 | Discharge: 2020-02-26 | Disposition: A | Discharge status: Home / Self Care | Payer: BC Managed Care – PPO | Source: Ambulatory Visit | Attending: Radiation Oncology | Admitting: Radiation Oncology

## 2020-02-26 DIAGNOSIS — Z510 Encounter for antineoplastic radiation therapy: Secondary | ICD-10-CM | POA: Diagnosis not present

## 2020-02-27 ENCOUNTER — Ambulatory Visit
Admission: RE | Admit: 2020-02-27 | Discharge: 2020-02-27 | Disposition: A | Discharge status: Home / Self Care | Payer: BC Managed Care – PPO | Source: Ambulatory Visit | Attending: Radiation Oncology | Admitting: Radiation Oncology

## 2020-02-27 DIAGNOSIS — Z510 Encounter for antineoplastic radiation therapy: Secondary | ICD-10-CM | POA: Diagnosis not present

## 2020-02-28 ENCOUNTER — Ambulatory Visit
Admission: RE | Admit: 2020-02-28 | Discharge: 2020-02-28 | Disposition: A | Discharge status: Home / Self Care | Payer: BC Managed Care – PPO | Source: Ambulatory Visit | Attending: Radiation Oncology | Admitting: Radiation Oncology

## 2020-02-28 DIAGNOSIS — C61 Malignant neoplasm of prostate: Secondary | ICD-10-CM | POA: Insufficient documentation

## 2020-02-28 DIAGNOSIS — Z510 Encounter for antineoplastic radiation therapy: Secondary | ICD-10-CM | POA: Diagnosis not present

## 2020-02-29 ENCOUNTER — Ambulatory Visit
Admission: RE | Admit: 2020-02-29 | Discharge: 2020-02-29 | Disposition: A | Discharge status: Home / Self Care | Payer: BC Managed Care – PPO | Source: Ambulatory Visit | Attending: Radiation Oncology | Admitting: Radiation Oncology

## 2020-02-29 DIAGNOSIS — Z510 Encounter for antineoplastic radiation therapy: Secondary | ICD-10-CM | POA: Diagnosis not present

## 2020-03-01 ENCOUNTER — Ambulatory Visit
Admission: RE | Admit: 2020-03-01 | Discharge: 2020-03-01 | Disposition: A | Discharge status: Home / Self Care | Payer: BC Managed Care – PPO | Source: Ambulatory Visit | Attending: Radiation Oncology | Admitting: Radiation Oncology

## 2020-03-01 DIAGNOSIS — Z510 Encounter for antineoplastic radiation therapy: Secondary | ICD-10-CM | POA: Diagnosis not present

## 2020-03-04 ENCOUNTER — Ambulatory Visit
Admission: RE | Admit: 2020-03-04 | Discharge: 2020-03-04 | Disposition: A | Discharge status: Home / Self Care | Payer: BC Managed Care – PPO | Source: Ambulatory Visit | Attending: Radiation Oncology | Admitting: Radiation Oncology

## 2020-03-04 DIAGNOSIS — Z510 Encounter for antineoplastic radiation therapy: Secondary | ICD-10-CM | POA: Diagnosis not present

## 2020-03-05 ENCOUNTER — Ambulatory Visit
Admission: RE | Admit: 2020-03-05 | Discharge: 2020-03-05 | Disposition: A | Discharge status: Home / Self Care | Payer: BC Managed Care – PPO | Source: Ambulatory Visit | Attending: Radiation Oncology | Admitting: Radiation Oncology

## 2020-03-05 DIAGNOSIS — Z510 Encounter for antineoplastic radiation therapy: Secondary | ICD-10-CM | POA: Diagnosis not present

## 2020-03-06 ENCOUNTER — Other Ambulatory Visit: Payer: Self-pay

## 2020-03-06 ENCOUNTER — Ambulatory Visit
Admission: RE | Admit: 2020-03-06 | Discharge: 2020-03-06 | Disposition: A | Discharge status: Home / Self Care | Payer: BC Managed Care – PPO | Source: Ambulatory Visit | Attending: Radiation Oncology | Admitting: Radiation Oncology

## 2020-03-06 DIAGNOSIS — Z510 Encounter for antineoplastic radiation therapy: Secondary | ICD-10-CM | POA: Diagnosis not present

## 2020-03-07 ENCOUNTER — Ambulatory Visit
Admission: RE | Admit: 2020-03-07 | Discharge: 2020-03-07 | Disposition: A | Discharge status: Home / Self Care | Payer: BC Managed Care – PPO | Source: Ambulatory Visit | Attending: Radiation Oncology | Admitting: Radiation Oncology

## 2020-03-07 DIAGNOSIS — Z510 Encounter for antineoplastic radiation therapy: Secondary | ICD-10-CM | POA: Diagnosis not present

## 2020-03-08 ENCOUNTER — Ambulatory Visit
Admission: RE | Admit: 2020-03-08 | Discharge: 2020-03-08 | Disposition: A | Discharge status: Home / Self Care | Payer: BC Managed Care – PPO | Source: Ambulatory Visit | Attending: Radiation Oncology | Admitting: Radiation Oncology

## 2020-03-08 DIAGNOSIS — Z510 Encounter for antineoplastic radiation therapy: Secondary | ICD-10-CM | POA: Diagnosis not present

## 2020-03-10 ENCOUNTER — Ambulatory Visit: Payer: BC Managed Care – PPO

## 2020-03-11 ENCOUNTER — Ambulatory Visit
Admission: RE | Admit: 2020-03-11 | Discharge: 2020-03-11 | Disposition: A | Discharge status: Home / Self Care | Payer: BC Managed Care – PPO | Source: Ambulatory Visit | Attending: Radiation Oncology | Admitting: Radiation Oncology

## 2020-03-11 DIAGNOSIS — Z510 Encounter for antineoplastic radiation therapy: Secondary | ICD-10-CM | POA: Diagnosis not present

## 2020-03-12 ENCOUNTER — Ambulatory Visit
Admission: RE | Admit: 2020-03-12 | Discharge: 2020-03-12 | Disposition: A | Discharge status: Home / Self Care | Payer: BC Managed Care – PPO | Source: Ambulatory Visit | Attending: Radiation Oncology | Admitting: Radiation Oncology

## 2020-03-12 DIAGNOSIS — Z510 Encounter for antineoplastic radiation therapy: Secondary | ICD-10-CM | POA: Diagnosis not present

## 2020-03-13 ENCOUNTER — Ambulatory Visit
Admission: RE | Admit: 2020-03-13 | Discharge: 2020-03-13 | Disposition: A | Discharge status: Home / Self Care | Payer: BC Managed Care – PPO | Source: Ambulatory Visit | Attending: Radiation Oncology | Admitting: Radiation Oncology

## 2020-03-13 DIAGNOSIS — Z510 Encounter for antineoplastic radiation therapy: Secondary | ICD-10-CM | POA: Diagnosis not present

## 2020-03-14 ENCOUNTER — Other Ambulatory Visit: Payer: Self-pay

## 2020-03-14 ENCOUNTER — Ambulatory Visit
Admission: RE | Admit: 2020-03-14 | Discharge: 2020-03-14 | Disposition: A | Discharge status: Home / Self Care | Payer: BC Managed Care – PPO | Source: Ambulatory Visit | Attending: Radiation Oncology | Admitting: Radiation Oncology

## 2020-03-14 DIAGNOSIS — Z510 Encounter for antineoplastic radiation therapy: Secondary | ICD-10-CM | POA: Diagnosis not present

## 2020-03-15 ENCOUNTER — Ambulatory Visit
Admission: RE | Admit: 2020-03-15 | Discharge: 2020-03-15 | Disposition: A | Discharge status: Home / Self Care | Payer: BC Managed Care – PPO | Source: Ambulatory Visit | Attending: Radiation Oncology | Admitting: Radiation Oncology

## 2020-03-15 ENCOUNTER — Other Ambulatory Visit: Payer: Self-pay

## 2020-03-15 DIAGNOSIS — Z510 Encounter for antineoplastic radiation therapy: Secondary | ICD-10-CM | POA: Diagnosis not present

## 2020-03-18 ENCOUNTER — Ambulatory Visit: Payer: BC Managed Care – PPO | Attending: Family

## 2020-03-18 ENCOUNTER — Ambulatory Visit
Admission: RE | Admit: 2020-03-18 | Discharge: 2020-03-18 | Disposition: A | Discharge status: Home / Self Care | Payer: BC Managed Care – PPO | Source: Ambulatory Visit | Attending: Radiation Oncology | Admitting: Radiation Oncology

## 2020-03-18 DIAGNOSIS — Z23 Encounter for immunization: Secondary | ICD-10-CM

## 2020-03-18 DIAGNOSIS — Z510 Encounter for antineoplastic radiation therapy: Secondary | ICD-10-CM | POA: Diagnosis not present

## 2020-03-19 ENCOUNTER — Ambulatory Visit: Admission: RE | Admit: 2020-03-19 | Payer: BC Managed Care – PPO | Source: Ambulatory Visit

## 2020-03-20 ENCOUNTER — Ambulatory Visit
Admission: RE | Admit: 2020-03-20 | Discharge: 2020-03-20 | Disposition: A | Discharge status: Home / Self Care | Payer: BC Managed Care – PPO | Source: Ambulatory Visit | Attending: Radiation Oncology | Admitting: Radiation Oncology

## 2020-03-20 DIAGNOSIS — Z510 Encounter for antineoplastic radiation therapy: Secondary | ICD-10-CM | POA: Diagnosis not present

## 2020-03-21 ENCOUNTER — Ambulatory Visit
Admission: RE | Admit: 2020-03-21 | Discharge: 2020-03-21 | Disposition: A | Discharge status: Home / Self Care | Payer: BC Managed Care – PPO | Source: Ambulatory Visit | Attending: Radiation Oncology | Admitting: Radiation Oncology

## 2020-03-21 ENCOUNTER — Encounter: Payer: Self-pay | Admitting: Medical Oncology

## 2020-03-21 DIAGNOSIS — Z510 Encounter for antineoplastic radiation therapy: Secondary | ICD-10-CM | POA: Diagnosis not present

## 2020-03-25 ENCOUNTER — Other Ambulatory Visit: Payer: Self-pay

## 2020-03-25 ENCOUNTER — Ambulatory Visit
Admission: RE | Admit: 2020-03-25 | Discharge: 2020-03-25 | Disposition: A | Discharge status: Home / Self Care | Payer: BC Managed Care – PPO | Source: Ambulatory Visit | Attending: Radiation Oncology | Admitting: Radiation Oncology

## 2020-03-25 DIAGNOSIS — Z510 Encounter for antineoplastic radiation therapy: Secondary | ICD-10-CM | POA: Diagnosis not present

## 2020-03-26 ENCOUNTER — Ambulatory Visit
Admission: RE | Admit: 2020-03-26 | Discharge: 2020-03-26 | Disposition: A | Discharge status: Home / Self Care | Payer: BC Managed Care – PPO | Source: Ambulatory Visit | Attending: Radiation Oncology | Admitting: Radiation Oncology

## 2020-03-26 DIAGNOSIS — Z510 Encounter for antineoplastic radiation therapy: Secondary | ICD-10-CM | POA: Diagnosis not present

## 2020-03-27 ENCOUNTER — Ambulatory Visit: Payer: BC Managed Care – PPO

## 2020-03-27 ENCOUNTER — Ambulatory Visit
Admission: RE | Admit: 2020-03-27 | Discharge: 2020-03-27 | Disposition: A | Discharge status: Home / Self Care | Payer: BC Managed Care – PPO | Source: Ambulatory Visit | Attending: Radiation Oncology | Admitting: Radiation Oncology

## 2020-03-27 DIAGNOSIS — Z510 Encounter for antineoplastic radiation therapy: Secondary | ICD-10-CM | POA: Diagnosis not present

## 2020-03-28 ENCOUNTER — Ambulatory Visit
Admission: RE | Admit: 2020-03-28 | Discharge: 2020-03-28 | Disposition: A | Discharge status: Home / Self Care | Payer: BC Managed Care – PPO | Source: Ambulatory Visit | Attending: Radiation Oncology | Admitting: Radiation Oncology

## 2020-03-28 ENCOUNTER — Encounter: Payer: Self-pay | Admitting: Urology

## 2020-03-28 ENCOUNTER — Ambulatory Visit: Payer: BC Managed Care – PPO

## 2020-03-28 DIAGNOSIS — Z510 Encounter for antineoplastic radiation therapy: Secondary | ICD-10-CM | POA: Diagnosis not present

## 2020-04-17 ENCOUNTER — Other Ambulatory Visit: Payer: Self-pay

## 2020-04-17 ENCOUNTER — Ambulatory Visit
Admission: RE | Admit: 2020-04-17 | Discharge: 2020-04-17 | Disposition: A | Discharge status: Home / Self Care | Payer: BC Managed Care – PPO | Source: Ambulatory Visit | Attending: Urology | Admitting: Urology

## 2020-04-17 ENCOUNTER — Telehealth: Payer: Self-pay

## 2020-04-17 DIAGNOSIS — C61 Malignant neoplasm of prostate: Secondary | ICD-10-CM

## 2020-04-17 NOTE — Progress Notes (Signed)
  Radiation Oncology         [phone] ________________________________  Name: Caleb Waller MRN: [id number]  Date: 03/28/2020  DOB: 1960-11-25  End of Treatment Note  Diagnosis:   60 y.o. gentleman with Stage T1c adenocarcinoma of the prostate with Gleason score of 4+3, and PSA of 4.73     Indication for treatment:  Curative, Definitive Radiotherapy       Radiation treatment dates:   02/15/20 - 03/28/20  Site/dose:   The prostate was treated to 70 Gy in 28 fractions of 2.5 Gy  Beams/energy:   The patient was treated with IMRT using volumetric arc therapy delivering 6 MV X-rays to clockwise and counterclockwise circumferential arcs with a 90 degree collimator offset to avoid dose scalloping.  Image guidance was performed with daily cone beam CT prior to each fraction to align to gold markers in the prostate and assure proper bladder and rectal fill volumes.  Immobilization was achieved with BodyFix custom mold.  Narrative: The patient tolerated radiation treatment relatively well with only minor urinary irritation and modest fatigue. He reported urgency and nocturia x3 but maintained a good flow of stream and denied dysuria, gross hematuria, straining to void, incomplete emptying or incontinence. He did not experience any bowel issues.  Plan: The patient has completed radiation treatment. He will return to radiation oncology clinic for routine followup in one month. I advised him to call or return sooner if he has any questions or concerns related to his recovery or treatment. ________________________________  Sheral Apley. Tammi Klippel, M.D.

## 2020-04-17 NOTE — Progress Notes (Signed)
Radiation Oncology         [phone] ________________________________  Name: Tywone Bembenek MRN: [id number]  Date: 04/17/2020  DOB: 02-05-1961  Post Treatment Note  CC: Willey Blade, MD  Lucas Mallow, MD  Diagnosis:   60 y.o. gentleman with Stage T1c adenocarcinoma of the prostate with Gleason score of 4+3, and PSA of 4.73     Interval Since Last Radiation:  3 weeks  02/15/20 - 03/28/20:   The prostate was treated to 70 Gy in 28 fractions of 2.5 Gy  Narrative: I spoke with the patient to conduct his routine scheduled 1 month follow up visit via telephone to spare the patient unnecessary potential exposure in the healthcare setting during the current COVID-19 pandemic.  The patient was notified in advance and gave permission to proceed with this visit format. He tolerated radiation treatment relatively well with only minor urinary irritation and modest fatigue. He reported urgency and nocturia x3 but maintained a good flow of stream and denied dysuria, gross hematuria, straining to void, incomplete emptying or incontinence. He did not experience any bowel issues.                              On review of systems, the patient states that he is doing very well in general.  He has noted gradual improvement in his LUTS and feels that he is pretty much back to his baseline at this point.  He specifically denies dysuria, gross hematuria, straining to void, incomplete emptying or incontinence.  He denies any abdominal pain or issues with his bowels.  He reports a healthy appetite and is maintaining his weight.  He has not noticed any significant impact on his energy level and has remained active.  He has a scheduled follow-up visit with Dr. Gloriann Loan on 04/18/2020.  ALLERGIES:  has No Known Allergies.  Meds: Current Outpatient Medications  Medication Sig Dispense Refill  . amLODipine (NORVASC) 5 MG tablet Take 5 mg by mouth daily.    Marland Kitchen omeprazole (PRILOSEC) 40 MG capsule Take 40 mg by mouth  daily.    . valsartan (DIOVAN) 320 MG tablet Take 320 mg by mouth daily.    . Vitamin D, Ergocalciferol, (DRISDOL) 1.25 MG (50000 UNIT) CAPS capsule Take 50,000 Units by mouth once a week.     No current facility-administered medications for this encounter.    Physical Findings:  vitals were not taken for this visit.   /Unable to assess due to telephone follow up visit format.  Lab Findings: Lab Results  Component Value Date   WBC 7.8 07/12/2011   HGB 14.3 07/12/2011   HCT 42.0 07/12/2011   MCV 80.2 07/12/2011   PLT 198 07/12/2011     Radiographic Findings: No results found.  Impression/Plan: 1. 60 y.o. gentleman with Stage T1c adenocarcinoma of the prostate with Gleason score of 4+3, and PSA of 4.73    He will continue to follow up with urology for ongoing PSA determinations and has an appointment scheduled with Dr. Gloriann Loan on 04/18/20. He understands what to expect with regards to PSA monitoring going forward. I will look forward to following his response to treatment via correspondence with urology, and would be happy to continue to participate in his care if clinically indicated. I talked to the patient about what to expect in the future, including his risk for erectile dysfunction and rectal bleeding. I encouraged him to call or return to the  office if he has any questions regarding his previous radiation or possible radiation side effects. He was comfortable with this plan and will follow up as needed.    Nicholos Johns, PA-C

## 2020-04-17 NOTE — Telephone Encounter (Signed)
Left voicemail message in regards to telephone appointment today @ 8:30am. Called to review meaningful use , AUA and prostate questions. TM

## 2020-07-23 NOTE — Progress Notes (Signed)
   Covid-19 Vaccination Clinic  Name:  Caleb Waller    MRN: [id number] DOB: Aug 13, 1960  07/23/2020  Mr. Caleb Waller was observed post Covid-19 immunization for 15 minutes without incident. He was provided with Vaccine Information Sheet and instruction to access the V-Safe system.   Mr. Caleb Waller was instructed to call 911 with any severe reactions post vaccine: Marland Kitchen Difficulty breathing  . Swelling of face and throat  . A fast heartbeat  . A bad rash all over body  . Dizziness and weakness   Immunizations Administered    Name Date Dose VIS Date Route   Moderna Covid-19 Booster Vaccine 03/18/2020 12:45 PM 0.25 mL 01/17/2020 Intramuscular   Manufacturer: Moderna   Lot: [id number]   [place]: [id number]

## 2022-07-07 ENCOUNTER — Ambulatory Visit: Payer: BC Managed Care – PPO | Admitting: Podiatry

## 2022-07-07 ENCOUNTER — Encounter: Payer: Self-pay | Admitting: Podiatry

## 2022-07-07 ENCOUNTER — Ambulatory Visit (INDEPENDENT_AMBULATORY_CARE_PROVIDER_SITE_OTHER): Payer: BC Managed Care – PPO

## 2022-07-07 DIAGNOSIS — M722 Plantar fascial fibromatosis: Secondary | ICD-10-CM

## 2022-07-07 DIAGNOSIS — Q72899 Other reduction defects of unspecified lower limb: Secondary | ICD-10-CM

## 2022-07-07 DIAGNOSIS — B353 Tinea pedis: Secondary | ICD-10-CM | POA: Diagnosis not present

## 2022-07-07 DIAGNOSIS — Q666 Other congenital valgus deformities of feet: Secondary | ICD-10-CM | POA: Diagnosis not present

## 2022-07-07 MED ORDER — MELOXICAM 15 MG PO TABS: 15.0000 mg | ORAL_TABLET | Freq: Every day | ORAL | 3 refills | Status: AC

## 2022-07-07 MED ORDER — TERBINAFINE HCL 250 MG PO TABS: 250.0000 mg | ORAL_TABLET | Freq: Every day | ORAL | 0 refills | Status: DC

## 2022-07-07 MED ORDER — TRIAMCINOLONE ACETONIDE 40 MG/ML IJ SUSP
40.0000 mg | Freq: Once | INTRAMUSCULAR | Status: AC
  Administered 2022-07-07: 40 mg

## 2022-07-07 MED ORDER — METHYLPREDNISOLONE 4 MG PO TBPK: ORAL_TABLET | ORAL | 0 refills | Status: DC

## 2022-07-07 NOTE — Patient Instructions (Signed)

## 2022-07-07 NOTE — Progress Notes (Signed)
  Subjective:  Patient ID: Caleb Waller, male    DOB: 05-25-1960,  MRN: [id number] HPI Chief Complaint  Patient presents with   Foot Pain    Plantar heel and arch bilateral - aching x several months, AM pains, tried Advil/Tylenol, history PF 2019-still has night splint and PF braces at home   New Patient (Initial Visit)    Est pt 2019    62 y.o. male presents with the above complaint.  ROS  ROS: Denies fever chills nausea vomit muscle aches pains calf pain back pain chest pain shortness of breath.  Past Medical History:  Diagnosis Date   Acid reflux    Hypertension    Prostate cancer    No past surgical history on file.  Current Outpatient Medications:    meloxicam (MOBIC) 15 MG tablet, Take 1 tablet (15 mg total) by mouth daily., Disp: 30 tablet, Rfl: 3   methylPREDNISolone (MEDROL DOSEPAK) 4 MG TBPK tablet, 6 day dose pack - take as directed, Disp: 21 tablet, Rfl: 0   terbinafine (LAMISIL) 250 MG tablet, Take 1 tablet (250 mg total) by mouth daily., Disp: 30 tablet, Rfl: 0   amLODipine (NORVASC) 5 MG tablet, Take 5 mg by mouth daily., Disp: , Rfl:    omeprazole (PRILOSEC) 40 MG capsule, Take 40 mg by mouth daily., Disp: , Rfl:    valsartan (DIOVAN) 320 MG tablet, Take 320 mg by mouth daily., Disp: , Rfl:    Vitamin D, Ergocalciferol, (DRISDOL) 1.25 MG (50000 UNIT) CAPS capsule, Take 50,000 Units by mouth once a week., Disp: , Rfl:   No Known Allergies Review of Systems Objective:  There were no vitals filed for this visit.  General: Well developed, nourished, in no acute distress, alert and oriented x3   Dermatological: Skin is warm, dry and supple bilateral. Nails x 10 are well maintained; remaining integument appears unremarkable at this time. There are no open sores, no preulcerative lesions, no rash or signs of infection present.  Tinea pedis bilateral  Vascular: Dorsalis Pedis artery and Posterior Tibial artery pedal pulses are 2/4 bilateral with immedate capillary fill  time. Pedal hair growth present. No varicosities and no lower extremity edema present bilateral.   Neruologic: Grossly intact via light touch bilateral. Vibratory intact via tuning fork bilateral. Protective threshold with Semmes Wienstein monofilament intact to all pedal sites bilateral. Patellar and Achilles deep tendon reflexes 2+ bilateral. No Babinski or clonus noted bilateral.   Musculoskeletal: No gross boney pedal deformities bilateral. No pain, crepitus, or limitation noted with foot and ankle range of motion bilateral. Muscular strength 5/5 in all groups tested bilateral.  Pain on palpation medial calcaneal tubercle bilateral heel.  Mild pes planovalgus bilateral.  Gait: Unassisted, Nonantalgic.    Radiographs:  Radiographs taken today demonstrate osseously mature individual mild pes planovalgus bilateral.  Soft tissue increase in density of plantar fascial Caney insertion sites plantar distally Orakay heel spurs are noted. Assessment & Plan:   Assessment: Pes planovalgus bilateral Planter fasciitis bilateral tinea pedis bilateral  Plan: Started him on Lamisil 30 tablets.  Started him on methylprednisolone to be followed by meloxicam.  Injected the bilateral heels today 20 mg Kenalog 5 mg Marcaine.  Instructed him on appropriate shoe gear stretching exercise ice therapy and shoe gear modifications.  He already has braces and night splints at home.     Relda Agosto [place], [place]

## 2022-07-15 DIAGNOSIS — M79676 Pain in unspecified toe(s): Secondary | ICD-10-CM

## 2022-08-18 ENCOUNTER — Ambulatory Visit: Payer: BC Managed Care – PPO | Admitting: Podiatry

## 2022-08-20 ENCOUNTER — Ambulatory Visit: Payer: BC Managed Care – PPO | Admitting: Podiatry

## 2022-08-20 ENCOUNTER — Encounter: Payer: Self-pay | Admitting: Podiatry

## 2022-08-20 DIAGNOSIS — B353 Tinea pedis: Secondary | ICD-10-CM

## 2022-08-20 DIAGNOSIS — M722 Plantar fascial fibromatosis: Secondary | ICD-10-CM | POA: Diagnosis not present

## 2022-08-20 MED ORDER — TERBINAFINE HCL 250 MG PO TABS: 250.0000 mg | ORAL_TABLET | Freq: Every day | ORAL | 0 refills | Status: AC

## 2022-08-20 MED ORDER — TRIAMCINOLONE ACETONIDE 40 MG/ML IJ SUSP
40.0000 mg | Freq: Once | INTRAMUSCULAR | Status: AC
  Administered 2022-08-20: 40 mg

## 2022-08-20 NOTE — Progress Notes (Signed)
He presents today for follow-up of his Planter fasciitis and his tinea pedis bilaterally.  States that the tinea pedis is nearly resolved.  Had no problems taking medication.  He states that his Planter fasciitis is approximately 60% improved both feet.  Objective: Vital signs are stable he is alert and oriented x 3 there is no erythema Dem salines drainage or odor tinea pedis is nearly 100% resolved has some areas on the around the heel that very well could be tinea pedis.  He still has some tenderness on palpation medial calcaneal tubercles but minimal.  Assessment: Resolving Planter fasciitis bilateral 60% at least.  Tinea pedis is nearly 100% resolved.  Plan: Will do another dose of Lamisil 30 tablets 1 p.o. daily for 1 more month.  I am going to also reinject the bilateral heels today 20 mg Kenalog 5 mg Marcaine point maximal tenderness.  Follow-up with [place] in 1 month necessary.

## 2022-09-24 ENCOUNTER — Ambulatory Visit: Payer: BC Managed Care – PPO | Admitting: Podiatry

## 2022-09-28 ENCOUNTER — Encounter: Payer: Self-pay | Admitting: Skilled Nursing Facility1

## 2022-09-28 ENCOUNTER — Encounter: Payer: BC Managed Care – PPO | Attending: Internal Medicine | Admitting: Skilled Nursing Facility1

## 2022-09-28 VITALS — Ht 67.0 in | Wt 250.0 lb

## 2022-09-28 DIAGNOSIS — E119 Type 2 diabetes mellitus without complications: Secondary | ICD-10-CM | POA: Diagnosis present

## 2022-09-28 NOTE — Progress Notes (Signed)
Diabetes Self-Management Education  Visit Type: First/Initial   09/28/2022  Caleb Waller, identified by name and date of birth, is a 62 y.o. male with a diagnosis of Diabetes: Type 2.   ASSESSMENT  Height 5\' 7"  (1.702 m), weight 250 lb (113.4 kg). Body mass index is 39.16 kg/m.  Goals: Reduce how often you eat out Increase water to 64 ounces per day Reduce juice Increase activity to 30 minutes per day starting with 2 days per week  Pt states he is married and his wife cooks for him. Pt states he works in [employer]. Pt states he does have GERD and does take medicine for that.  Pt sates he started checks his blood sugar once a week always fasting: 114 Pt states he has broken out ina  sweat randomly.  Pt states he works from 1pm to 10pm.  Pt arrievs having made some great changes already such as cuttign out sweet tea and usng egg whites.    Diabetes Self-Management Education - 09/28/22 0925       Visit Information   Visit Type First/Initial      Initial Visit   Diabetes Type Type 2    Are you currently following a meal plan? No    Are you taking your medications as prescribed? Not on Medications      Health Coping   How would you rate your overall health? Fair      Psychosocial Assessment   Patient Belief/Attitude about Diabetes Afraid    What is the hardest part about your diabetes right now, causing you the most concern, or is the most worrisome to you about your diabetes?   Making healty food and beverage choices    Self-management support Family    Patient Concerns Nutrition/Meal planning    Special Needs None    Preferred Learning Style Visual    Learning Readiness Contemplating    How often do you need to have someone help you when you read instructions, pamphlets, or other written materials from your doctor or pharmacy? 1 - Never      Pre-Education Assessment   Patient understands the diabetes disease and treatment process. Needs Instruction    Patient  understands incorporating nutritional management into lifestyle. Needs Instruction    Patient undertands incorporating physical activity into lifestyle. Needs Instruction    Patient understands using medications safely. Needs Instruction    Patient understands monitoring blood glucose, interpreting and using results Needs Instruction    Patient understands prevention, detection, and treatment of acute complications. Needs Instruction    Patient understands prevention, detection, and treatment of chronic complications. Needs Instruction    Patient understands how to develop strategies to address psychosocial issues. Needs Instruction    Patient understands how to develop strategies to promote health/change behavior. Needs Instruction      Complications   Last HgB A1C per patient/outside source 6.6 %    How often do you check your blood sugar? 3-4 times / week    Fasting Blood glucose range (mg/dL) 16-109    Number of hypoglycemic episodes per month 0    Number of hyperglycemic episodes ( >200mg /dL): Never    Can you tell when your blood sugar is high? No    Have you had a dilated eye exam in the past 12 months? No    Have you had a dental exam in the past 12 months? Yes    Are you checking your feet? Yes    How many days  per week are you checking your feet? 7      Dietary Intake   Breakfast fast food sandwich + grits    Lunch [place] and cheese sandwich    Dinner 6pm: leftovers or smithfeilds or k and W    Snack (evening) honey bun    Beverage(s) orange juice, soda, water (50.7oz)      Activity / Exercise   Activity / Exercise Type Light (walking / raking leaves)    How many days per week do you exercise? 1    How many minutes per day do you exercise? 30    Total minutes per week of exercise 30      Patient Education   Previous Diabetes Education No    Disease Pathophysiology Definition of diabetes, type 1 and 2, and the diagnosis of diabetes;Factors that contribute to the  development of diabetes    Healthy Eating Role of diet in the treatment of diabetes and the relationship between the three main macronutrients and blood glucose level;Food label reading, portion sizes and measuring food.;Plate Method;Information on hints to eating out and maintain blood glucose control.    Being Active Role of exercise on diabetes management, blood pressure control and cardiac health.    Monitoring Taught/evaluated SMBG meter.;Purpose and frequency of SMBG.;Daily foot exams;Yearly dilated eye exam    Acute complications Discussed and identified patients' prevention, symptoms, and treatment of hyperglycemia.;Taught prevention, symptoms, and  treatment of hypoglycemia - the 15 rule.    Chronic complications Dental care;Retinopathy and reason for yearly dilated eye exams;Nephropathy, what it is, prevention of, the use of ACE, ARB's and early detection of through urine microalbumia.;Relationship between chronic complications and blood glucose control    Diabetes Stress and Support Role of stress on diabetes      Individualized Goals (developed by patient)   Nutrition General guidelines for healthy choices and portions discussed;Follow meal plan discussed    Physical Activity Exercise 5-7 days per week;30 minutes per day    Monitoring  Test my blood glucose as discussed;Test blood glucose pre and post meals as discussed    Problem Solving Eating Pattern    Reducing Risk do foot checks daily;treat hypoglycemia with 15 grams of carbs if blood glucose less than 70mg /dL      Post-Education Assessment   Patient understands the diabetes disease and treatment process. Demonstrates understanding / competency    Patient understands incorporating nutritional management into lifestyle. Demonstrates understanding / competency    Patient undertands incorporating physical activity into lifestyle. Demonstrates understanding / competency    Patient understands using medications safely. Demonstrates  understanding / competency    Patient understands monitoring blood glucose, interpreting and using results Demonstrates understanding / competency    Patient understands prevention, detection, and treatment of acute complications. Demonstrates understanding / competency    Patient understands prevention, detection, and treatment of chronic complications. Demonstrates understanding / competency    Patient understands how to develop strategies to address psychosocial issues. Demonstrates understanding / competency    Patient understands how to develop strategies to promote health/change behavior. Demonstrates understanding / competency      Outcomes   Expected Outcomes Demonstrated interest in learning but significant barriers to change    Future DMSE 4-6 wks    Program Status Completed             Individualized Plan for Diabetes Self-Management Training:   Learning Objective:  Patient will have a greater understanding of diabetes self-management. Patient education plan is to attend  individual and/or group sessions per assessed needs and concerns.    Expected Outcomes:  Demonstrated interest in learning but significant barriers to change  Education material provided: ADA - How to Thrive: A Guide for Your Journey with Diabetes, Food label handouts, Meal plan card, and My Plate  If problems or questions, patient to contact team via:  Phone and Email  Future DSME appointment: 4-6 wksA1C 6.6

## 2022-09-29 ENCOUNTER — Ambulatory Visit (INDEPENDENT_AMBULATORY_CARE_PROVIDER_SITE_OTHER): Payer: BC Managed Care – PPO | Admitting: Podiatry

## 2022-09-29 ENCOUNTER — Encounter: Payer: Self-pay | Admitting: Podiatry

## 2022-09-29 DIAGNOSIS — M722 Plantar fascial fibromatosis: Secondary | ICD-10-CM | POA: Diagnosis not present

## 2022-09-29 DIAGNOSIS — B353 Tinea pedis: Secondary | ICD-10-CM

## 2022-09-29 MED ORDER — TRIAMCINOLONE ACETONIDE 40 MG/ML IJ SUSP
40.0000 mg | Freq: Once | INTRAMUSCULAR | Status: AC
  Administered 2022-09-29: 40 mg

## 2022-09-29 NOTE — Progress Notes (Signed)
He presents today for follow-up of his tinea pedis and his Planter fasciitis.  He states that the tinea pedis is completely gone and that the fasciitis is approximately 70% improved.  States that he still has some tenderness in both heels.  Objective: Vital signs are stable alert and oriented x 3.  Pulses are palpable.  There is no erythema edema cellulitis drainage or odor there is no signs of tinea pedis no interdigital tinea pedis no signs of moccasin distribution.  I  He does still have tenderness on palpation medial calcaneal tubercles though not nearly as severe as it has been in the past.  Assessment: Planter fasciitis is a resolving at about 70% 100% resolution of tinea pedis.  Plan: Reinjected his bilateral heels today 10 mg Kenalog 5 mg Marcaine point of maximal tenderness.  He tolerated procedure well and will follow-up with me with any recurrence.  I like to follow-up with him in about 2 months just to check the Planter fasciitis if necessary.

## 2022-11-10 ENCOUNTER — Ambulatory Visit: Payer: BC Managed Care – PPO | Admitting: Podiatrist

## 2022-11-24 ENCOUNTER — Ambulatory Visit (INDEPENDENT_AMBULATORY_CARE_PROVIDER_SITE_OTHER): Payer: BC Managed Care – PPO | Admitting: Podiatry

## 2022-11-24 ENCOUNTER — Encounter: Payer: Self-pay | Admitting: Podiatry

## 2022-11-24 DIAGNOSIS — M722 Plantar fascial fibromatosis: Secondary | ICD-10-CM

## 2022-11-24 MED ORDER — TRIAMCINOLONE ACETONIDE 40 MG/ML IJ SUSP
40.0000 mg | Freq: Once | INTRAMUSCULAR | Status: AC
  Administered 2022-11-24: 40 mg

## 2022-11-24 NOTE — Progress Notes (Signed)
He presents today for follow-up of his plantar fasciitis states that he is doing pretty well is about 80% improved at this point.  Objective: Vital signs stable alert oriented x 3.  Still has pain on palpation medial calcaneal tubercles bilaterally.  Assessment: Pain in limb secondary to plantar fasciitis but 80% resolved.  Plan: Found the maximal point of tenderness and injected that today with 20 mg Kenalog 5 mg Marcaine point of maximal tenderness and I will follow-up with him in about 4 to 6 weeks he will continue his meloxicam.

## 2022-12-07 ENCOUNTER — Ambulatory Visit: Payer: BC Managed Care – PPO | Admitting: Skilled Nursing Facility1

## 2023-01-05 ENCOUNTER — Ambulatory Visit: Payer: BC Managed Care – PPO | Admitting: Podiatry

## 2023-01-27 ENCOUNTER — Encounter (HOSPITAL_COMMUNITY): Payer: Self-pay

## 2023-01-27 ENCOUNTER — Ambulatory Visit (HOSPITAL_COMMUNITY)
Admission: EM | Admit: 2023-01-27 | Discharge: 2023-01-27 | Disposition: A | Discharge status: Home / Self Care | Payer: BC Managed Care – PPO | Attending: Sports Medicine | Admitting: Sports Medicine

## 2023-01-27 DIAGNOSIS — J069 Acute upper respiratory infection, unspecified: Secondary | ICD-10-CM

## 2023-01-27 LAB — POC COVID19/FLU A&B COMBO
Covid Antigen, POC: NEGATIVE
Influenza A Antigen, POC: NEGATIVE
Influenza B Antigen, POC: NEGATIVE

## 2023-01-27 MED ORDER — FLUTICASONE PROPIONATE 50 MCG/ACT NA SUSP: 2.0000 | Freq: Every day | NASAL | 0 refills | Status: DC

## 2023-01-27 MED ORDER — PROMETHAZINE-DM 6.25-15 MG/5ML PO SYRP: 5.0000 mL | ORAL_SOLUTION | Freq: Four times a day (QID) | ORAL | 0 refills | Status: AC | PRN

## 2023-01-27 NOTE — ED Triage Notes (Signed)
Pt states nasal and chest congestion for the past 3-4 days.  States he is taking dayquil and Nyquil at home.

## 2023-01-27 NOTE — Discharge Instructions (Addendum)
You have a viral respiratory infection. These are best managed with rest, hydration, and time. I have sent you some cough syrup (promethazine-dextromethrophan) to take up to every 6 hours. I also recommend you use flonase nasal spray (2 sprays per nostril) for the next week. You may continue the Dayquil and Nyquil.  If your cough worsens over the next week or you develop fevers, chills, chest pain, breathing difficulty these are reasons to return for re-evaluation.

## 2023-01-27 NOTE — ED Provider Notes (Signed)
[facility]    CSN: [id number] Arrival date & time: 01/27/23  1137      History   Chief Complaint Chief Complaint  Patient presents with   Nasal Congestion    HPI Cassiel Sharpley is a 62 y.o. male.   He presents with 3 days of sinus congestion, productive cough, nausea, and diarrhea. He has been treating himself with dayquil and nyquil without significant improvement. He has not had fevers or chills. Denies headaches. No known sick contacts. Non-smoker. He has not had any viral testing performed yet.   The history is provided by the patient.    Past Medical History:  Diagnosis Date   Acid reflux    Diabetes mellitus without complication (HCC)    Hypertension    Prostate cancer [facility])     Patient Active Problem List   Diagnosis Date Noted   Abnormal glucose level 01/02/2020   Benign essential hypertension 01/02/2020   Gastroesophageal reflux disease without esophagitis 01/02/2020   Hyperlipidemia 01/02/2020   Vitamin D deficiency 01/02/2020   Malignant neoplasm of prostate (HCC) 01/02/2020   Obesity 03/09/2018   Abnormal C-reactive protein 02/19/2015   High serum angiotensin converting enzyme (ACE) 02/19/2015    History reviewed. No pertinent surgical history.     Home Medications    Prior to Admission medications   Medication Sig Start Date End Date Taking? Authorizing Provider  amLODipine (NORVASC) 5 MG tablet Take 5 mg by mouth daily. 11/23/19   [provider]  hydrochlorothiazide (HYDRODIURIL) 25 MG tablet Take 25 mg by mouth daily. 07/15/22   [provider]  losartan-hydrochlorothiazide (HYZAAR) 100-25 MG tablet TAKE 1 TABLET BY MOUTH EVERY MORNING FOR HYPERTENSION STOP VALSARTAN ONCE START THIS ONE 07/15/22   [provider]  meloxicam (MOBIC) 15 MG tablet Take 1 tablet (15 mg total) by mouth daily. 07/07/22   Hyatt, Max T, DPM  omeprazole (PRILOSEC) 40 MG capsule Take 40 mg by mouth daily. 09/20/19   [provider]  rosuvastatin (CRESTOR) 10 MG tablet Take 1 tablet by mouth daily.    [provider]  terbinafine (LAMISIL) 250 MG tablet Take 1 tablet (250 mg total) by mouth daily. 08/20/22   Hyatt, Max T, DPM  valsartan (DIOVAN) 320 MG tablet Take 320 mg by mouth daily.    [provider]  Vitamin D, Ergocalciferol, (DRISDOL) 1.25 MG (50000 UNIT) CAPS capsule Take 50,000 Units by mouth once a week. 08/09/19   [provider]    Family History Family History  Problem Relation Age of Onset   Breast cancer Cousin     Social History Social History   Tobacco Use   Smoking status: Never   Smokeless tobacco: Never  Substance Use Topics   Alcohol use: No   Drug use: No     Allergies   Patient has no known allergies.   Review of Systems Review of Systems  Constitutional:  Negative for chills and fever.  HENT:  Positive for congestion, rhinorrhea, sinus pressure and sneezing. Negative for ear pain, hearing loss, sore throat and trouble swallowing.   Eyes:  Negative for visual disturbance.  Respiratory:  Positive for cough. Negative for chest tightness, shortness of breath and wheezing.   Cardiovascular:  Negative for chest pain.  Gastrointestinal:  Positive for diarrhea and nausea. Negative for abdominal pain, blood in stool and vomiting.  Skin:  Negative for rash.     Physical Exam Triage Vital Signs ED Triage Vitals  Encounter Vitals Group  BP 01/27/23 1152 111/70     Systolic BP Percentile --      Diastolic BP Percentile --      Pulse Rate 01/27/23 1152 72     Resp 01/27/23 1152 16     Temp 01/27/23 1152 98.4 F (36.9 C)     Temp Source 01/27/23 1152 Oral     SpO2 01/27/23 1152 95 %     Weight --      Height --      Head Circumference --      Peak Flow --      Pain Score 01/27/23 1154 0     Pain Loc --      Pain Education --      Exclude from Growth Chart --    No data found.  Updated Vital Signs BP 111/70 (BP Location: Left Arm)   Pulse  72   Temp 98.4 F (36.9 C) (Oral)   Resp 16   SpO2 95%   Visual Acuity Right Eye Distance:   Left Eye Distance:   Bilateral Distance:    Right Eye Near:   Left Eye Near:    Bilateral Near:     Physical Exam Constitutional:      General: He is not in acute distress.    Appearance: Normal appearance. He is normal weight. He is not ill-appearing.  HENT:     Head: Normocephalic and atraumatic.     Right Ear: Tympanic membrane, ear canal and external ear normal.     Left Ear: Tympanic membrane, ear canal and external ear normal.     Nose: Rhinorrhea present.     Mouth/Throat:     Mouth: Mucous membranes are moist.     Pharynx: Oropharynx is clear. No oropharyngeal exudate or posterior oropharyngeal erythema.  Eyes:     Extraocular Movements: Extraocular movements intact.     Conjunctiva/sclera: Conjunctivae normal.     Pupils: Pupils are equal, round, and reactive to light.  Cardiovascular:     Rate and Rhythm: Normal rate and regular rhythm.     Pulses: Normal pulses.     Heart sounds: Normal heart sounds. No murmur heard. Pulmonary:     Effort: Pulmonary effort is normal. No respiratory distress.     Breath sounds: Rhonchi present. No wheezing or rales.  Abdominal:     General: Bowel sounds are normal.     Palpations: Abdomen is soft.     Tenderness: There is no abdominal tenderness. There is no guarding or rebound.  Musculoskeletal:     Cervical back: Normal range of motion and neck supple. No rigidity or tenderness.  Lymphadenopathy:     Cervical: No cervical adenopathy.  Skin:    General: Skin is warm and dry.     Capillary Refill: Capillary refill takes less than 2 seconds.     Findings: No rash.  Neurological:     General: No focal deficit present.     Mental Status: He is alert and oriented to person, place, and time. Mental status is at baseline.  Psychiatric:        Mood and Affect: Mood normal.        Behavior: Behavior normal.      UC Treatments /  Results  Labs (all labs ordered are listed, but only abnormal results are displayed) Labs Reviewed  POC COVID19/FLU A&B COMBO    EKG   Radiology No results found.  Procedures Procedures (including critical care time)  Medications Ordered in UC  Medications - No data to display  Initial Impression / Assessment and Plan / UC Course  I have reviewed the triage vital signs and the nursing notes.  Pertinent labs & imaging results that were available during my care of the patient were reviewed by me and considered in my medical decision making (see chart for details).    Vitals and triage reviewed, patient is hemodynamically stable.  History and physical exam most consistent with viral respiratory infection POC flu and COVID performed today and are pending - I will communicate results to him once I have them if they are positive. No clear indication for chest imaging or antibiotics at this time. I reviewed supportive care measures with him. Reviewed return precautions. Patient's questions were answered and he is in agreement with this plan.  Final Clinical Impressions(s) / UC Diagnoses   Final diagnoses:  Viral upper respiratory tract infection     Discharge Instructions      You have a viral respiratory infection. These are best managed with rest, hydration, and time. I have sent you some cough syrup (promethazine-dextromethrophan) to take up to every 6 hours. I also recommend you use flonase nasal spray (2 sprays per nostril) for the next week. You may continue the Dayquil and Nyquil.  If your cough worsens over the next week or you develop fevers, chills, chest pain, breathing difficulty these are reasons to return for re-evaluation.     ED Prescriptions   None    PDMP not reviewed this encounter.   Marisa Cyphers, MD 01/27/23 [phone]

## 2023-04-29 DIAGNOSIS — Z0289 Encounter for other administrative examinations: Secondary | ICD-10-CM

## 2023-05-03 ENCOUNTER — Encounter (INDEPENDENT_AMBULATORY_CARE_PROVIDER_SITE_OTHER): Payer: 59 | Admitting: Family Medicine

## 2023-05-03 ENCOUNTER — Other Ambulatory Visit: Payer: Self-pay

## 2023-05-05 ENCOUNTER — Encounter (INDEPENDENT_AMBULATORY_CARE_PROVIDER_SITE_OTHER): Payer: Self-pay | Admitting: Adult Health

## 2023-05-05 ENCOUNTER — Ambulatory Visit (INDEPENDENT_AMBULATORY_CARE_PROVIDER_SITE_OTHER): Payer: 59 | Admitting: Adult Health

## 2023-05-05 VITALS — BP 138/82 | HR 78 | Temp 98.5°F | Ht 66.5 in | Wt 253.0 lb

## 2023-05-05 DIAGNOSIS — E785 Hyperlipidemia, unspecified: Secondary | ICD-10-CM

## 2023-05-05 DIAGNOSIS — E559 Vitamin D deficiency, unspecified: Secondary | ICD-10-CM

## 2023-05-05 DIAGNOSIS — I10 Essential (primary) hypertension: Secondary | ICD-10-CM | POA: Diagnosis not present

## 2023-05-05 DIAGNOSIS — Z0000 Encounter for general adult medical examination without abnormal findings: Secondary | ICD-10-CM

## 2023-05-05 DIAGNOSIS — Z6841 Body Mass Index (BMI) 40.0 and over, adult: Secondary | ICD-10-CM

## 2023-05-05 NOTE — Progress Notes (Signed)
 Office: [phone]  /  Fax: [phone]   Initial Visit  Caleb Waller was seen in clinic today to evaluate for obesity. He is interested in losing weight to improve overall health and reduce the risk of weight related complications. He presents today to review program treatment options, initial physical assessment, and evaluation.     He was referred by: PCP  When asked what else they would like to accomplish? He states: Adopt healthier eating patterns, Improve energy levels and physical activity, Improve existing medical conditions, Reduce number of medications, and Improve quality of life Goal Weight 200 lbs, current weight 253 lbs  Weight history: Steady weight gain the last 5 years  When asked how has your weight affected you? He states: Contributed to medical problems, Contributed to orthopedic problems or mobility issues, Having fatigue, Having poor endurance, and Problems with eating patterns  Some associated conditions: Hypertension, Hyperlipidemia, and Vitamin D  Deficiency  Contributing factors: Reduced physical activity and Eating patterns  Weight promoting medications identified: None  Current nutrition plan: None  Current level of physical activity: None  Current or previous pharmacotherapy: None  Response to medication: Never tried medications   Past medical history includes:   Past Medical History:  Diagnosis Date   Acid reflux    Diabetes mellitus without complication (HCC)    Hypertension    Prostate cancer (HCC)      Objective:   BP 138/82   Pulse 78   Temp 98.5 F (36.9 C)   Ht 5' 6.5 (1.689 m)   Wt 253 lb (114.8 kg)   SpO2 96%   BMI 40.22 kg/m  He was weighed on the bioimpedance scale: Body mass index is 40.22 kg/m.  Peak Weight:260 , Body Fat%:35.0, Visceral Fat Rating:23, Weight trend over the last 12 months: Increasing  General:  Alert, oriented and cooperative. Patient is in no acute distress.  Respiratory: Normal respiratory effort,  no problems with respiration noted   Gait: able to ambulate independently  Mental Status: Normal mood and affect. Normal behavior. Normal judgment and thought content.   DIAGNOSTIC DATA REVIEWED:  BMET    Component Value Date/Time   NA 141 07/12/2011 2320   K 3.6 07/12/2011 2320   CL 105 07/12/2011 2320   CO2 25 07/12/2011 2303   GLUCOSE 144 (H) 07/12/2011 2320   BUN 13 07/12/2011 2320   CREATININE 1.10 07/12/2011 2320   CALCIUM 8.5 07/12/2011 2303   GFRNONAA 69 (L) 07/12/2011 2303   GFRAA 79 (L) 07/12/2011 2303   No results found for: HGBA1C No results found for: INSULIN  CBC    Component Value Date/Time   WBC 7.8 07/12/2011 2303   RBC 4.85 07/12/2011 2303   HGB 14.3 07/12/2011 2320   HCT 42.0 07/12/2011 2320   PLT 198 07/12/2011 2303   MCV 80.2 07/12/2011 2303   MCH 28.5 07/12/2011 2303   MCHC 35.5 07/12/2011 2303   RDW 13.4 07/12/2011 2303   Iron/TIBC/Ferritin/ %Sat No results found for: IRON, TIBC, FERRITIN, IRONPCTSAT Lipid Panel  No results found for: CHOL, TRIG, HDL, CHOLHDL, VLDL, LDLCALC, LDLDIRECT Hepatic Function Panel  No results found for: PROT, ALBUMIN, AST, ALT, ALKPHOS, BILITOT, BILIDIR, IBILI No results found for: TSH   Assessment and Plan:   Healthcare maintenance  Hypertension, unspecified type  Hyperlipidemia, unspecified hyperlipidemia type  Vitamin D  deficiency  Morbid obesity (HCC), Starting BMI 40.23  ESTABLISH WITH [facility]   Obesity Treatment / Action Plan:  Patient will work on garnering support from family and  friends to begin weight loss journey. Will work on eliminating or reducing the presence of highly palatable, calorie dense foods in the home. Will complete provided nutritional and psychosocial assessment questionnaire before the next appointment. Will be scheduled for indirect calorimetry to determine resting energy expenditure in a fasting state.  This will allow us  to create a  reduced calorie, high-protein meal plan to promote loss of fat mass while preserving muscle mass. Counseled on the health benefits of losing 5%-15% of total body weight. Was counseled on nutritional approaches to weight loss and benefits of reducing processed foods and consuming plant-based foods and high quality protein as part of nutritional weight management. Was counseled on pharmacotherapy and role as an adjunct in weight management.   Obesity Education Performed Today:  He was weighed on the bioimpedance scale and results were discussed and documented in the synopsis.  We discussed obesity as a disease and the importance of a more detailed evaluation of all the factors contributing to the disease.  We discussed the importance of long term lifestyle changes which include nutrition, exercise and behavioral modifications as well as the importance of customizing this to his specific health and social needs.  We discussed the benefits of reaching a healthier weight to alleviate the symptoms of existing conditions and reduce the risks of the biomechanical, metabolic and psychological effects of obesity.  Caleb Waller appears to be in the action stage of change and states they are ready to start intensive lifestyle modifications and behavioral modifications.  30 minutes was spent today on this visit including the above counseling, pre-visit chart review, and post-visit documentation.  Reviewed by clinician on day of visit: allergies, medications, problem list, medical history, surgical history, family history, social history, and previous encounter notes pertinent to obesity diagnosis.  Caleb Waller d. Solace Wendorff, NP-C

## 2023-06-02 ENCOUNTER — Ambulatory Visit (INDEPENDENT_AMBULATORY_CARE_PROVIDER_SITE_OTHER): Payer: 59 | Admitting: Family Medicine

## 2023-06-16 ENCOUNTER — Ambulatory Visit (INDEPENDENT_AMBULATORY_CARE_PROVIDER_SITE_OTHER): Payer: 59 | Admitting: Family Medicine

## 2023-06-28 ENCOUNTER — Ambulatory Visit (INDEPENDENT_AMBULATORY_CARE_PROVIDER_SITE_OTHER): Admitting: Family Medicine

## 2023-06-30 ENCOUNTER — Ambulatory Visit (INDEPENDENT_AMBULATORY_CARE_PROVIDER_SITE_OTHER): Admitting: Family Medicine

## 2023-07-12 ENCOUNTER — Ambulatory Visit (INDEPENDENT_AMBULATORY_CARE_PROVIDER_SITE_OTHER): Admitting: Family Medicine

## 2023-08-10 ENCOUNTER — Ambulatory Visit (INDEPENDENT_AMBULATORY_CARE_PROVIDER_SITE_OTHER): Admitting: Family Medicine

## 2023-08-10 ENCOUNTER — Other Ambulatory Visit (INDEPENDENT_AMBULATORY_CARE_PROVIDER_SITE_OTHER): Payer: Self-pay | Admitting: *Deleted

## 2023-08-24 ENCOUNTER — Ambulatory Visit (INDEPENDENT_AMBULATORY_CARE_PROVIDER_SITE_OTHER): Admitting: Family Medicine

## 2023-09-23 ENCOUNTER — Encounter (INDEPENDENT_AMBULATORY_CARE_PROVIDER_SITE_OTHER): Payer: Self-pay

## 2023-09-28 ENCOUNTER — Encounter (INDEPENDENT_AMBULATORY_CARE_PROVIDER_SITE_OTHER): Payer: Self-pay | Admitting: Family Medicine

## 2023-09-28 ENCOUNTER — Ambulatory Visit (INDEPENDENT_AMBULATORY_CARE_PROVIDER_SITE_OTHER): Admitting: Family Medicine

## 2023-09-28 VITALS — BP 138/84 | HR 79 | Temp 98.7°F | Ht 66.5 in | Wt 258.0 lb

## 2023-09-28 DIAGNOSIS — R5383 Other fatigue: Secondary | ICD-10-CM

## 2023-09-28 DIAGNOSIS — G4733 Obstructive sleep apnea (adult) (pediatric): Secondary | ICD-10-CM | POA: Diagnosis not present

## 2023-09-28 DIAGNOSIS — Z6841 Body Mass Index (BMI) 40.0 and over, adult: Secondary | ICD-10-CM

## 2023-09-28 DIAGNOSIS — R0602 Shortness of breath: Secondary | ICD-10-CM | POA: Diagnosis not present

## 2023-09-28 DIAGNOSIS — E559 Vitamin D deficiency, unspecified: Secondary | ICD-10-CM | POA: Diagnosis not present

## 2023-09-28 DIAGNOSIS — F5089 Other specified eating disorder: Secondary | ICD-10-CM | POA: Diagnosis not present

## 2023-09-28 DIAGNOSIS — I10 Essential (primary) hypertension: Secondary | ICD-10-CM

## 2023-09-28 DIAGNOSIS — E669 Obesity, unspecified: Secondary | ICD-10-CM

## 2023-09-28 DIAGNOSIS — Z1331 Encounter for screening for depression: Secondary | ICD-10-CM

## 2023-09-28 DIAGNOSIS — E785 Hyperlipidemia, unspecified: Secondary | ICD-10-CM | POA: Diagnosis not present

## 2023-09-28 NOTE — Progress Notes (Signed)
 Caleb Waller, D.O.  ABFM, ABOM Specializing in Clinical Bariatric Medicine Office located at: [address]  [place], [place]  [address]   Bariatric Medicine Visit  Dear Caleb Iha, MD   Thank you for referring Caleb Waller to our clinic today for evaluation.  We performed a consultation to discuss his options for treatment and educate the patient on his disease state.  The following note includes my evaluation and treatment recommendations.   Please do not hesitate to reach out to me directly if you have any further concerns.    Assessment and Plan:   Orders Placed This Encounter  Procedures   VITAMIN D  25 Hydroxy (Vit-D Deficiency, Fractures)   TSH   T4, free   Lipid panel   Insulin , random   Hemoglobin A1c   Folate   Comprehensive metabolic panel with GFR   Vitamin B12   CBC with Differential/Platelet   EKG 12-Lead    There are no discontinued medications.   No orders of the defined types were placed in this encounter.     Labs obtained today (CBC, CMP, folate, A1c, insulin , lipid panel, free T4, TSH, Vit B, and Vit D) will be reviewed at next OV.    FOR THE DISEASE OF OBESITY:  Assessment & Plan: Recommended Dietary Goals Caleb Waller is currently in the action stage of change. As such, his goal is to start our weight management plan.  He has agreed to implement: Follow modified Category 3 Meal Plan with B options and only 200 snack calories   Behavioral Intervention We discussed the following Behavioral Modification Strategies today: increasing lean protein intake to established goals, decreasing simple carbohydrates , increasing vegetables, increasing fiber rich foods, avoiding skipping meals, increasing water intake , work on managing stress, creating time for self-care and relaxation, and staying on track while traveling and vacationing  Additional resources provided today: Handout on CAT 3 meal plan  Evidence-based interventions for health  behavior change were utilized today including the discussion of self monitoring techniques, problem-solving barriers and SMART goal setting techniques.    Pt will specifically work on: Implementation of meal plan   Recommended Physical Activity Goals Caleb Waller has been advised to work up to 150 minutes of moderate intensity aerobic activity a week and strengthening exercises 2-3 times per week for cardiovascular health, weight loss maintenance and preservation of muscle mass.   He has agreed to : maintain current level of activity.    Pharmacotherapy We discussed various medication options to help Caleb Waller with his weight loss efforts and we both agreed to: Continue with current nutritional and behavioral strategies   ASSOCIATED CONDITIONS ADDRESSED TODAY:  Fatigue Assessment & Plan: Caleb Waller does feel that his weight is causing his energy to be lower than it should be. Fatigue may be related to obesity, depression or many other causes. he does not appear to have any red flag symptoms and this appears to most likely be related to his current lifestyle habits and dietary intake.  Labs will be ordered and reviewed with him at their next office visit in two weeks. Orders: -     TSH -     T4, free -     Hemoglobin A1c -     Folate -     Vitamin B12 -     Lipid panel -     Insulin , random -     Comprehensive metabolic panel with GFR -     CBC with Differential/Platelet -  EKG 12-Lead -     VITAMIN D  25 Hydroxy (Vit-D Deficiency, Fractures)    Epworth sleepiness scale is 24, which is the highest score possible. He notes he mistakenly selected responses to the ESS about his experiences prior to him starting CPAP therapy. Caleb Waller admits to daytime somnolence and denies waking up still tired. Patient has a history of symptoms of Epworth sleepiness scale and hypertension. Caleb Waller generally gets 6-7 hours of sleep per night, and states that he has generally restful sleep. Snoring is present. Apneic  episodes are not present.    OSA on CPAP Assessment & Plan: Pt has a hx of severe sleep apnea; was dx about 1 yr ago. Is compliant with CPAP nightly. Since starting CPAP therapy, patient has experienced improvements with morning headaches, sleep, quality, and apnea episodes. Aim for 7-9 hours a night. He recalls that he stops breathing about 70 times per hour. Unable to review sleep study notes due to these providers being in [place] system. He has continued to follow up as instructed by them.    ECG: Performed and reviewed/ interpreted independently.  Normal sinus rhythm, rate 67 bpm; reassuring without any acute abnormalities. When compared to EKG from 06/2011, today's EKG is largely unchanged with no acute abnormalities. Will continue to monitor for symptoms    Shortness of breath on exertion Assessment & Plan: Caleb Waller does feel that he gets out of breath more easily than he used to when he exercises and seems to be worsening over time with weight gain.  This has gotten worse recently. Caleb Waller denies shortness of breath at rest or orthopnea. Pt denies chest pain, dizziness, heart palpitations, or excessive diaphoresis or nausea with activity.  This is not new and is ongoing.  Caleb Waller's shortness of breath appears to be obesity related and exercise induced, as they do not appear to have any red flag symptoms/ concerns today.  Also, this condition appears to be related to a state of poor cardiovascular conditioning   Obtain labs today and will be reviewed with him at their next office visit in two weeks.  Indirect Calorimeter completed today to help guide our dietary regimen. It shows a VO2 of 311 and a REE of 2146.  His calculated basal metabolic rate is 7734 thus his resting energy expenditure is worse than expected.  Patient agreed to work on weight loss at this time.  As Caleb Waller progresses through our weight loss program, we will gradually increase exercise as tolerated to treat his current  condition.   If Dareld follows our recommendations and loses 5-10% of their weight without improvement of his shortness of breath or if at any time, symptoms become more concerning, they agree to urgently follow up with their PCP/ specialist for further consideration/ evaluation.   Caleb Waller verbalizes agreement with this plan.   Other specified eating disorder- emotional eating Depression Screen  Assessment & Plan: His PHQ-9 score was 7 and doesn't make life difficult at all. Denies any depression and anxiety. Moods stable currently. Denies any SI/HI. Pt was informed of our Bariatric Psychologist, Dr. Sharron, who Waller evaluate PHQ-9 scores and help with significant struggle with emotional eating. Advised that additional mental health support may be necessary to manage any coexisting or broader mood-related concerns beyond the scope of bariatric care. Patient DECLINED Dr.Barker referall at this time.  Counseling was provided on the importance of stress management, exercise, and self-care activities like meditation and adequate sleep (7-9 hrs/night). Advised pt on the importance of following their prudent  nutritional meal plan and how food Waller affect moods in addition to emotional eating. Stressed the importance of following up with their PCP.   Per New Patient Packet: Pt reports he tends to eat when he is stressed, as a reward, and to comfort himself.  He sometimes feels out of control with his eating.  Report he is eating more than he intended to without realizing about 2-3 times a week.    Hypertension, unspecified type Assessment & Plan: BP Readings from Last 3 Encounters:  09/28/23 138/84  05/05/23 138/82  01/27/23 111/70   Pt was dx'ed about 10 years ago. Condition is overall well controlled. Compliant with Amlodipine 10 mg once daily and Hyzaar 100-25 mg once daily. Tolerating well with no SE. Per pt, his BPs have been overall well controlled. No fmhx of heart disease. Reviewed BP goal with  patient. Continue with current antihypertensive regimen. Encouraged pt to continue following a heart-healthy diet via her meal plan --> avoid foods in high-sodium and pre-package foods. Ambulatory blood pressure monitoring encouraged.  Reminded patient that if they ever feel poorly in any way, to check their blood pressure and pulse as well. Continue weight loss therapy, losing 10% or more of body weight may improve condition. Continue with current medication regimen. We will continue to monitor symptoms as they relate to the his weight loss journey.   Vitamin D  deficiency Assessment & Plan: Compliant with ERGO once weekly for the past 3-4 yrs. Good compliance and tolerance. Ideal vit D levels reviewed with patient. Provided vit D counseling. Discussed the improvements optimal vit D Waller have on energy, joint aches, and weight loss efforts. Will monitor levels closely alongside PCP.  Will be checking labs and review at his next OV. Will recheck vit D levels today and review results at his next OV.    Hyperlipidemia, unspecified hyperlipidemia type Assessment & Plan: Pt has hx of HLD, which he states has developed over the past 3-4 yrs. He is on Crestor once daily. Tolerating well with no adverse side effects. No fmhx of early CAD, or MI; although he reports his dad had his first stroke in his 15s.  Reviewed ideal HDL and LDL goal with pt. Advised pt to follow a heart healthy diet via his prudent nutritional meal plan. Work on decreasing simple carbs and sugars and reducing your saturated and trans fast. Will plan to gradually increase exercise when able. Continue with current statin therapy as prescribed. Will continue monitoring alongside PCP/specialists. Will check lipid panel today and review results at next OV. .     FOLLOW UP:   Follow up in 2 weeks. He was informed of the importance of frequent follow up visits to maximize his success with intensive lifestyle modifications for his multiple  health conditions.  Zlatan Hornback is aware that we will review all of his lab results at our next visit.  He is aware that if anything is critical/ life threatening with the results, we will be contacting him via MyChart prior to the office visit to discuss management.    Chief Complaint:   OBESITY Caleb Waller (MR# [id number]) is a pleasant 63 y.o. male who presents for evaluation and treatment of obesity and related comorbidities. Current BMI is Body mass index is 41.02 kg/m. Caleb Waller has been struggling with his weight for many years and has been unsuccessful in either losing weight, maintaining weight loss, or reaching his healthy weight goal.  Caleb Waller is currently in the action stage of  change and ready to dedicate time achieving and maintaining a healthier weight. Caleb Waller is interested in becoming our patient and working on intensive lifestyle modifications including (but not limited to) diet and exercise for weight loss.  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 Patient is married to and lives with his wife Caleb Waller.    Seldomly walks 15 minutes a day   Desires to be 180 lbs   Reasons for wanting to lose weight: looks and feel better   Peak wt: 260 lbs  Doesn't identify any reasons for why he gained weight   Eats outside the home with take out/fast food at least 4 days a week  Cravings: none  Snacks on cookies and ice cream  Snacks frequently between meals at least once daily   Skips breakfast daily  Drinks 1 regular soda per day  Also drinks juice, tea (sweetened), and doesn't use artificial sweeteners   Worst food habit: sweet tea and bread     Subjective:   This is the patient's first visit at [facility].  The patient's NEW PATIENT PACKET that they filled out prior to today's office visit was reviewed at length and information from that paperwork was included within the following office visit note.    Included in the packet: current  and past health history, medications, allergies, ROS, gynecologic history (women only), surgical history, family history, social history, weight history, weight loss surgery history (for those that have had weight loss surgery), nutritional evaluation, mood and food questionnaire along with a depression screening (PHQ9) on all patients, an Epworth questionnaire, sleep habits questionnaire, patient life and health improvement goals questionnaire. These will all be scanned into the patient's chart under the media tab.   Review of Systems: Please refer to new patient packet scanned into media. Pertinent positives were addressed with patient today.  Reviewed by clinician on day of visit: allergies, medications, problem list, medical history, surgical history, family history, social history, and previous encounter notes.  During the visit, I independently reviewed the patient's EKG, bioimpedance scale results, and indirect calorimeter results. I used this information to tailor a meal plan for the patient that will help Caleb Waller to lose weight and will improve his obesity-related conditions going forward.  I performed a medically necessary appropriate examination and/or evaluation. I discussed the assessment and treatment plan with the patient. The patient was provided an opportunity to ask questions and all were answered. The patient agreed with the plan and demonstrated an understanding of the instructions. Labs were ordered today (unless patient declined them) and will be reviewed with the patient at our next visit unless more critical results need to be addressed immediately. Clinical information was updated and documented in the EMR.    Objective:   PHYSICAL EXAM: Blood pressure 138/84, pulse 79, temperature 98.7 F (37.1 C), height 5' 6.5 (1.689 m), weight 258 lb (117 kg), SpO2 99%. Body mass index is 41.02 kg/m.  General: Well Developed, well nourished, and in no acute distress.  HEENT:  Normocephalic, atraumatic; EOMI, sclerae are anicteric. Skin: Warm and dry, good turgor Chest:  Normal excursion, shape, no gross ABN Respiratory: No conversational dyspnea; speaking in full sentences NeuroM-Sk:  Normal gross ROM * 4 extremities  Psych: A and O *3, insight adequate, mood- full   Anthropometric Measurements Height: 5' 6.5 (1.689 m) Weight: 258 lb (117 kg) BMI (Calculated): 41.02 Weight at Last Visit: 0lb Weight Lost Since Last Visit: 0lb Weight Gained Since Last Visit: 0lb  Starting Weight: 258lb Total Weight Loss (lbs): 0 lb (0 kg) Peak Weight: 260lb Waist Measurement : 51 inches   Body Composition  Body Fat %: 35.8 % Fat Mass (lbs): 92.4 lbs Muscle Mass (lbs): 157.4 lbs Total Body Water (lbs): 119.8 lbs Visceral Fat Rating : 23   Other Clinical Data RMR: [id number] Fasting: Yes Labs: Yes Today's Visit #: 1 Starting Date: 09/28/23 Comments: First Visit    DIAGNOSTIC DATA REVIEWED:  BMET    Component Value Date/Time   NA 141 07/12/2011 2320   K 3.6 07/12/2011 2320   CL 105 07/12/2011 2320   CO2 25 07/12/2011 2303   GLUCOSE 144 (H) 07/12/2011 2320   BUN 13 07/12/2011 2320   CREATININE 1.10 07/12/2011 2320   CALCIUM 8.5 07/12/2011 2303   GFRNONAA 69 (L) 07/12/2011 2303   GFRAA 79 (L) 07/12/2011 2303   No results found for: HGBA1C No results found for: INSULIN  No results found for: TSH CBC    Component Value Date/Time   WBC 7.8 07/12/2011 2303   RBC 4.85 07/12/2011 2303   HGB 14.3 07/12/2011 2320   HCT 42.0 07/12/2011 2320   PLT 198 07/12/2011 2303   MCV 80.2 07/12/2011 2303   MCH 28.5 07/12/2011 2303   MCHC 35.5 07/12/2011 2303   RDW 13.4 07/12/2011 2303   Iron Studies No results found for: IRON, TIBC, FERRITIN, IRONPCTSAT Lipid Panel  No results found for: CHOL, TRIG, HDL, CHOLHDL, VLDL, LDLCALC, LDLDIRECT Hepatic Function Panel  No results found for: PROT, ALBUMIN, AST, ALT, ALKPHOS, BILITOT,  BILIDIR, IBILI No results found for: TSH Nutritional No results found for: VD25OH  Attestation Statements:   I, Vernell Forest, acting as a medical scribe for Caleb Jenkins, DO., have compiled all relevant documentation for today's office visit on behalf of Caleb Jenkins, DO, while in the presence of [employer], DO.  I have reviewed the above documentation for accuracy and completeness, and I agree with the above. Caleb JINNY Waller, D.O.  The 21st Century Cures Act was signed into law in 2016 which includes the topic of electronic health records.  This provides immediate access to information in MyChart.  This includes consultation notes, operative notes, office notes, lab results and pathology reports.  If you have any questions about what you read please let us  know at your next visit so we Waller discuss your concerns and take corrective action if need be.  We are right here with you.

## 2023-09-29 LAB — LIPID PANEL
Chol/HDL Ratio: 2.7 ratio (ref 0.0–5.0)
Cholesterol, Total: 126 mg/dL (ref 100–199)
HDL: 47 mg/dL (ref >39)
LDL Chol Calc (NIH): 64 mg/dL (ref 0–99)
Triglycerides: 71 mg/dL (ref 0–149)
VLDL Cholesterol Cal: 15 mg/dL (ref 5–40)

## 2023-09-29 LAB — COMPREHENSIVE METABOLIC PANEL WITH GFR
ALT: 16 IU/L (ref 0–44)
AST: 18 IU/L (ref 0–40)
Albumin: 4 g/dL (ref 3.8–4.9)
Alkaline Phosphatase: 80 IU/L (ref 44–121)
BUN/Creatinine Ratio: 10 (ref 10–24)
BUN: 13 mg/dL (ref 8–27)
Bilirubin Total: 0.3 mg/dL (ref 0.0–1.2)
CO2: 24 mmol/L (ref 20–29)
Calcium: 9 mg/dL (ref 8.6–10.2)
Chloride: 100 mmol/L (ref 96–106)
Creatinine, Ser: 1.3 mg/dL — ABNORMAL HIGH (ref 0.76–1.27)
Globulin, Total: 2.8 g/dL (ref 1.5–4.5)
Glucose: 103 mg/dL — ABNORMAL HIGH (ref 70–99)
Potassium: 4 mmol/L (ref 3.5–5.2)
Sodium: 138 mmol/L (ref 134–144)
Total Protein: 6.8 g/dL (ref 6.0–8.5)
eGFR: 63 mL/min/{1.73_m2} (ref >59)

## 2023-09-29 LAB — CBC WITH DIFFERENTIAL/PLATELET
Basophils Absolute: 0 10*3/uL (ref 0.0–0.2)
Basos: 0 %
EOS (ABSOLUTE): 0.1 10*3/uL (ref 0.0–0.4)
Eos: 2 %
Hematocrit: 39.8 % (ref 37.5–51.0)
Hemoglobin: 13.7 g/dL (ref 13.0–17.7)
Immature Grans (Abs): 0 10*3/uL (ref 0.0–0.1)
Immature Granulocytes: 0 %
Lymphocytes Absolute: 2.5 10*3/uL (ref 0.7–3.1)
Lymphs: 37 %
MCH: 29.9 pg (ref 26.6–33.0)
MCHC: 34.4 g/dL (ref 31.5–35.7)
MCV: 87 fL (ref 79–97)
Monocytes Absolute: 0.7 10*3/uL (ref 0.1–0.9)
Monocytes: 11 %
Neutrophils Absolute: 3.3 10*3/uL (ref 1.4–7.0)
Neutrophils: 50 %
Platelets: 190 10*3/uL (ref 150–450)
RBC: 4.58 x10E6/uL (ref 4.14–5.80)
RDW: 14.4 % (ref 11.6–15.4)
WBC: 6.6 10*3/uL (ref 3.4–10.8)

## 2023-09-29 LAB — FOLATE: Folate: 8.8 ng/mL (ref >3.0)

## 2023-09-29 LAB — VITAMIN D 25 HYDROXY (VIT D DEFICIENCY, FRACTURES): Vit D, 25-Hydroxy: 32.6 ng/mL (ref 30.0–100.0)

## 2023-09-29 LAB — HEMOGLOBIN A1C
Est. average glucose Bld gHb Est-mCnc: 134 mg/dL
Hgb A1c MFr Bld: 6.3 % — ABNORMAL HIGH (ref 4.8–5.6)

## 2023-09-29 LAB — T4, FREE: Free T4: 1.09 ng/dL (ref 0.82–1.77)

## 2023-09-29 LAB — TSH: TSH: 1.75 u[IU]/mL (ref 0.450–4.500)

## 2023-09-29 LAB — VITAMIN B12: Vitamin B-12: 311 pg/mL (ref 232–1245)

## 2023-09-29 LAB — INSULIN, RANDOM: INSULIN: 50.7 u[IU]/mL — ABNORMAL HIGH (ref 2.6–24.9)

## 2023-10-12 ENCOUNTER — Encounter (INDEPENDENT_AMBULATORY_CARE_PROVIDER_SITE_OTHER): Payer: Self-pay | Admitting: Family Medicine

## 2023-10-12 ENCOUNTER — Ambulatory Visit (INDEPENDENT_AMBULATORY_CARE_PROVIDER_SITE_OTHER): Admitting: Family Medicine

## 2023-10-12 VITALS — BP 133/75 | HR 81 | Temp 98.5°F | Ht 66.5 in | Wt 257.0 lb

## 2023-10-12 DIAGNOSIS — R7303 Prediabetes: Secondary | ICD-10-CM

## 2023-10-12 DIAGNOSIS — Z6841 Body Mass Index (BMI) 40.0 and over, adult: Secondary | ICD-10-CM

## 2023-10-12 DIAGNOSIS — E785 Hyperlipidemia, unspecified: Secondary | ICD-10-CM | POA: Diagnosis not present

## 2023-10-12 DIAGNOSIS — I10 Essential (primary) hypertension: Secondary | ICD-10-CM

## 2023-10-12 DIAGNOSIS — E538 Deficiency of other specified B group vitamins: Secondary | ICD-10-CM

## 2023-10-12 DIAGNOSIS — E559 Vitamin D deficiency, unspecified: Secondary | ICD-10-CM

## 2023-10-12 DIAGNOSIS — R7989 Other specified abnormal findings of blood chemistry: Secondary | ICD-10-CM

## 2023-10-12 MED ORDER — CYANOCOBALAMIN 500 MCG PO TABS: 500.0000 ug | ORAL_TABLET | Freq: Every day | ORAL | Status: DC

## 2023-10-12 MED ORDER — VITAMIN D (ERGOCALCIFEROL) 1.25 MG (50000 UNIT) PO CAPS: ORAL_CAPSULE | ORAL | Status: DC

## 2023-10-12 NOTE — Progress Notes (Signed)
 Caleb Waller, D.O.  ABFM, ABOM Clinical Bariatric Medicine Physician  Office located at: [address] [place], [place]  [address]   Assessment and Plan:   Medications Discontinued During This Encounter  Medication Reason   Vitamin D , Ergocalciferol , (DRISDOL ) 1.25 MG (50000 UNIT) CAPS capsule    hydrochlorothiazide (HYDRODIURIL) 25 MG tablet    valsartan (DIOVAN) 320 MG tablet    fluticasone  (FLONASE ) 50 MCG/ACT nasal spray      Meds ordered this encounter  Medications   Vitamin D , Ergocalciferol , (DRISDOL ) 1.25 MG (50000 UNIT) CAPS capsule    Sig: Every Mon and th   cyanocobalamin  (VITAMIN B12) 500 MCG tablet    Sig: Take 1 tablet (500 mcg total) by mouth daily.     FOR THE DISEASE OF OBESITY:  BMI 40.0-44.9, adult (HCC) current 40.86 Morbid obesity Starting [facility]) 41.0 Assessment & Plan: Since last office visit on 09/28/2023 patient's  Muscle mass has decreased by 1 lb. Fat mass has increased by 1 lb. Total body water has decreased by 0.4 lb.  Counseling done on how various foods will affect these numbers and how to maximize success  Total lbs lost to date: - 1 lb  Total weight loss percentage to date: -0.38%    Recommended Dietary Goals Caleb Waller is currently in the action stage of change. As such, his goal is to continue weight management plan.  He has agreed to: continue current plan   Behavioral Intervention We discussed the following today: the concept of food is medicine, practice mindfulness eating and understand the difference between hunger signals and cravings and continue to practice mindfulness when eating  Additional resources provided today: Handout on recognition of cravings vs hunger; and emotional versus physical hunger, Handout on Pre-Diabetes education , and Handout on insulin  resistance education, Handout on Mindful Eating Tips  Evidence-based interventions for health behavior change were utilized today including the discussion of self  monitoring techniques, problem-solving barriers and SMART goal setting techniques.  Regarding patient's less desirable eating habits and patterns, we employed the technique of small changes.   Pt will specifically work on measuring his veggie and protein intake.   Recommended Physical Activity Goals Caleb Waller has been advised to work up to 150 minutes of moderate intensity aerobic activity a week and strengthening exercises 2-3 times per week for cardiovascular health, weight loss maintenance and preservation of muscle mass.   He has agreed to : continue to gradually increase the amount and intensity of exercise routine   Pharmacotherapy We both agreed to : Continue with current nutritional and behavioral strategies   ASSOCIATED CONDITIONS ADDRESSED TODAY:   Prediabetes - new onset Assessment & Plan:  Lab Results  Component Value Date   HGBA1C 6.3 (H) 09/28/2023   INSULIN  50.7 (H) 09/28/2023   Lab Results  Component Value Date   WBC 6.6 09/28/2023   HGB 13.7 09/28/2023   HCT 39.8 09/28/2023   MCV 87 09/28/2023   PLT 190 09/28/2023   Lab Results  Component Value Date   TSH 1.750 09/28/2023   FREET4 1.09 09/28/2023    Hemoglobin A1c consistent with prediabetes. Fasting insulin  is about 10 times normal (goal <5). Blood counts and thyroid levels are WNL.   - Patient aware of disease state and risk of progression.  - Explained role of simple carbs and insulin  levels on hunger and cravings.  - Educated patient that having adequate amounts of protein with each meal is important for increasing muscle mass, stabilizing sugars,  controlling hunger and cravings, and improving thermogenesis. - Continue working on nutrition plan to decrease simple carbohydrates, increase lean proteins and exercise to promote weight loss and improve glycemic control and prevent progression to T2DM.    Hypertension, unspecified type Assessment & Plan: Last 3 blood pressure readings in our office are as  follows: BP Readings from Last 3 Encounters:  10/12/23 133/75  09/28/23 138/84  05/05/23 138/82   BP is stable on Norvasc 10 mg daily and Hyzaar 100-25 mg daily. Pt completely asx. No acute concerns.   - Continue antihypertensives.  - Goal BP: 130/80 or less on a regular basis.  - Continue to work on nutrition plan to promote weight loss and improve BP control.     Hyperlipidemia, unspecified hyperlipidemia type Assessment & Plan: Lab Results  Component Value Date   CHOL 126 09/28/2023   HDL 47 09/28/2023   LDLCALC 64 09/28/2023   TRIG 71 09/28/2023   CHOLHDL 2.7 09/28/2023      Component Value Date/Time   PROT 6.8 09/28/2023 0945   ALBUMIN 4.0 09/28/2023 0945   AST 18 09/28/2023 0945   ALT 16 09/28/2023 0945   ALKPHOS 80 09/28/2023 0945   BILITOT 0.3 09/28/2023 0945   Reports good compliance and tolerance of Rosuvastatin 10 mg daily. Reviewed most recent lipid panel and liver enzymes which are within normal limits.   - Continue to avoid fatty meats, butters, creams, etc.  - Limit red meat intake to no more than two days a week.  - Continue heart healthy meal plan.  - Advance exercise as able to raise HDL.     Elevated serum creatinine Assessment & Plan: Lab Results  Component Value Date   CREATININE 1.30 (H) 09/28/2023   BUN 13 09/28/2023   NA 138 09/28/2023   K 4.0 09/28/2023   CL 100 09/28/2023   CO2 24 09/28/2023   Serum creatinine is elevated at 1.30. This diagnosis was reviewed with the patient and education was provided.   - Goal Serum Creatinine: <1.  - Adequate hydration, exercise, controlling blood sugars/blood pressure, and avoiding nephrotoxic substances like NSAIDs can improve serum creatinine.  - Will continue to monitor expectantly.     Vitamin D  deficiency Assessment & Plan: Lab Results  Component Value Date   VD25OH 32.6 09/28/2023   Compliant with OTC Ergocalciferol  once weekly for the past 3-4 yrs. His Vitamin D  levels are not at  goal of 50 to 70. I discussed the importance of vitamin D  to the patient's health and well-being as well as to their ability to lose weight.  - Pt instructed to INCREASE OTC strength Ergocalciferol  to biweekly.   - Continue weight loss efforts.  - Recheck: 3-4 months.     B12 deficiency - new onset  Assessment & Plan: Lab Results  Component Value Date   VITAMINB12 311 09/28/2023   Lab Results  Component Value Date   FOLATE 8.8 09/28/2023   This diagnosis was reviewed with the patient and education was provided. Folate is in normal range. B12 level is 311, not at goal of over 500.   - Pt instructed to INITIATE OTC B12 500 mcg daily.  - Continue nutrient rich meal plan.  - Recheck levels in 3-4 months.     FOLLOW UP:   Return 10/26/2023 at 9:00 AM. He was informed of the importance of frequent follow up visits to maximize his success with intensive lifestyle modifications for his multiple health conditions.   Subjective:  Chief complaint: Obesity Caleb Waller is here to discuss his progress with his obesity treatment plan. He is on the  Category 3 Meal Plan with B options and only 200 snack calories and states he is following his eating plan approximately 75 % of the time. He states he is walking 30 minutes 3 days per week.   Interval History:  Caleb Waller is here today for his first follow-up office visit since starting the program with us . Patient is off to a good start and has lost 1 lb. He officially began implementation of his meal plan after the 4th of July Weekend. He endorses not measuring his proteins. He feels its difficult to get in all his foods. States its also difficult to stay within his 200 snack calories due to carb cravings. At times, he admits to snacking on off-plan foods.    Pharmacotherapy for weight loss: none   Review of Systems:  Pertinent positives were addressed with patient today.   Reviewed by clinician on day of visit: allergies, medications,  problem list, medical history, surgical history, family history, social history, and previous encounter notes.  Weight Summary and Biometrics   Weight Lost Since Last Visit: 1lb  Weight Gained Since Last Visit: 0lb   Vitals Temp: 98.5 F (36.9 C) BP: 133/75 Pulse Rate: 81 SpO2: 99 %   Anthropometric Measurements Height: 5' 6.5 (1.689 m) Weight: 257 lb (116.6 kg) BMI (Calculated): 40.86 Weight at Last Visit: 258lb Weight Lost Since Last Visit: 1lb Weight Gained Since Last Visit: 0lb Starting Weight: 258lb Total Weight Loss (lbs): 1 lb (0.454 kg) Peak Weight: 260lb Waist Measurement : 51 inches   Body Composition  Body Fat %: 36.2 % Fat Mass (lbs): 93.4 lbs Muscle Mass (lbs): 156.4 lbs Total Body Water (lbs): 119.4 lbs Visceral Fat Rating : 24   Other Clinical Data RMR: 2146 Fasting: No Labs: No Today's Visit #: 2 Starting Date: 09/28/23 Comments: Second Visit     Objective:   PHYSICAL EXAM:  Blood pressure 133/75, pulse 81, temperature 98.5 F (36.9 C), height 5' 6.5 (1.689 m), weight 257 lb (116.6 kg), SpO2 99%. Body mass index is 40.86 kg/m.  General: he is overweight, cooperative and in no acute distress.   HEENT: EOMI, sclerae are anicteric. Lungs: Normal breathing effort, no conversational dyspnea. M-Sk:  Normal gross ROM * 4 extremities  PSYCH: Has normal mood, affect and thought process. Neurologic: No gross sensory or motor deficits. Well developed, A and O * 3  DIAGNOSTIC DATA REVIEWED:  BMET    Component Value Date/Time   NA 138 09/28/2023 0945   K 4.0 09/28/2023 0945   CL 100 09/28/2023 0945   CO2 24 09/28/2023 0945   GLUCOSE 103 (H) 09/28/2023 0945   GLUCOSE 144 (H) 07/12/2011 2320   BUN 13 09/28/2023 0945   CREATININE 1.30 (H) 09/28/2023 0945   CALCIUM 9.0 09/28/2023 0945   GFRNONAA 69 (L) 07/12/2011 2303   GFRAA 79 (L) 07/12/2011 2303   Lab Results  Component Value Date   HGBA1C 6.3 (H) 09/28/2023   Lab Results   Component Value Date   INSULIN  50.7 (H) 09/28/2023   Lab Results  Component Value Date   TSH 1.750 09/28/2023   CBC    Component Value Date/Time   WBC 6.6 09/28/2023 0945   WBC 7.8 07/12/2011 2303   RBC 4.58 09/28/2023 0945   RBC 4.85 07/12/2011 2303   HGB 13.7 09/28/2023 0945   HCT 39.8 09/28/2023 0945   PLT  190 09/28/2023 0945   MCV 87 09/28/2023 0945   MCH 29.9 09/28/2023 0945   MCH 28.5 07/12/2011 2303   MCHC 34.4 09/28/2023 0945   MCHC 35.5 07/12/2011 2303   RDW 14.4 09/28/2023 0945   Iron Studies No results found for: IRON, TIBC, FERRITIN, IRONPCTSAT Lipid Panel     Component Value Date/Time   CHOL 126 09/28/2023 0945   TRIG 71 09/28/2023 0945   HDL 47 09/28/2023 0945   CHOLHDL 2.7 09/28/2023 0945   LDLCALC 64 09/28/2023 0945   Hepatic Function Panel     Component Value Date/Time   PROT 6.8 09/28/2023 0945   ALBUMIN 4.0 09/28/2023 0945   AST 18 09/28/2023 0945   ALT 16 09/28/2023 0945   ALKPHOS 80 09/28/2023 0945   BILITOT 0.3 09/28/2023 0945      Component Value Date/Time   TSH 1.750 09/28/2023 0945   Nutritional Lab Results  Component Value Date   VD25OH 32.6 09/28/2023    Attestations:   I, Special Puri, acting as a Stage manager for [employer], DO., have compiled all relevant documentation for today's office visit on behalf of Caleb Jenkins, DO, while in the presence of [employer], DO.  I have spent 47 minutes in the care of the patient today including 37 minutes on face-to-face counseling and reviewing listed medical problems above as outlined in office visit note, providing nutritional and behavioral counseling as outlined in obesity care plan, independently interpreting results and goals of care, see listed medical problems, and discussing biometric information and progress. We reviewed his meal plan and discussed how the foods he's eating is affecting each one of his labs. Pt educated on why we want him to eat various  foods in various amounts and has a better understanding of the nutritional plan because of this. All his questions were answered today.   I have reviewed the above documentation for accuracy and completeness, and I agree with the above. Caleb JINNY Waller, D.O.  The 21st Century Cures Act was signed into law in 2016 which includes the topic of electronic health records.  This provides immediate access to information in MyChart. This includes consultation notes, operative notes, office notes, lab results and pathology reports.  If you have any questions about what you read please let us  know at your next visit so we can discuss your concerns and take corrective action if need be.  We are right here with you.

## 2023-10-26 ENCOUNTER — Ambulatory Visit (INDEPENDENT_AMBULATORY_CARE_PROVIDER_SITE_OTHER): Admitting: Family Medicine

## 2023-10-26 ENCOUNTER — Encounter (INDEPENDENT_AMBULATORY_CARE_PROVIDER_SITE_OTHER): Payer: Self-pay | Admitting: Family Medicine

## 2023-10-26 VITALS — BP 126/78 | HR 74 | Temp 98.6°F | Ht 66.5 in | Wt 257.0 lb

## 2023-10-26 DIAGNOSIS — E1169 Type 2 diabetes mellitus with other specified complication: Secondary | ICD-10-CM

## 2023-10-26 DIAGNOSIS — I152 Hypertension secondary to endocrine disorders: Secondary | ICD-10-CM

## 2023-10-26 DIAGNOSIS — E1159 Type 2 diabetes mellitus with other circulatory complications: Secondary | ICD-10-CM | POA: Diagnosis not present

## 2023-10-26 DIAGNOSIS — Z6841 Body Mass Index (BMI) 40.0 and over, adult: Secondary | ICD-10-CM

## 2023-10-26 DIAGNOSIS — E559 Vitamin D deficiency, unspecified: Secondary | ICD-10-CM | POA: Diagnosis not present

## 2023-10-26 MED ORDER — VITAMIN D (ERGOCALCIFEROL) 1.25 MG (50000 UNIT) PO CAPS: ORAL_CAPSULE | ORAL | Status: DC

## 2023-10-26 MED ORDER — CYANOCOBALAMIN 500 MCG PO TABS: 500.0000 ug | ORAL_TABLET | Freq: Every day | ORAL | Status: DC

## 2023-10-26 NOTE — Progress Notes (Signed)
 Caleb Waller, D.O.  ABFM, ABOM Specializing in Clinical Bariatric Medicine  Office located at: [address] [place], [place]  [address]   Assessment and Plan:   Medications Discontinued During This Encounter  Medication Reason   Vitamin D , Ergocalciferol , (DRISDOL ) 1.25 MG (50000 UNIT) CAPS capsule Reorder   cyanocobalamin  (VITAMIN B12) 500 MCG tablet Reorder     Meds ordered this encounter  Medications   cyanocobalamin  (VITAMIN B12) 500 MCG tablet    Sig: Take 1 tablet (500 mcg total) by mouth daily.   Vitamin D , Ergocalciferol , (DRISDOL ) 1.25 MG (50000 UNIT) CAPS capsule    Sig: Every Mon and th     FOR THE DISEASE OF OBESITY:  BMI 40.0-44.9, adult (HCC) current 40.86 Morbid obesity Starting [facility]) 41.0 Assessment & Plan: Since last office visit on 10/12/2023 patient's muscle mass has decreased by 0.6 lbs. Fat mass has increased by 0.2 lbs. Total body water has increased by 1.6 lbs.  Counseling done on how various foods will affect these numbers and how to maximize success  Total lbs lost to date: - 1 lb Total weight loss percentage to date: -0.39 %   Recommended Dietary Goals Nikhil is currently in the action stage of change. As such, his goal is to continue weight management plan.  He has agreed to: continue current plan   Behavioral Intervention We discussed the following today: continue to work on maintaining a reduced calorie state, getting the recommended amount of protein, incorporating whole foods, making healthy choices, staying well hydrated and practicing mindfulness when eating.  Additional resources provided today: None  Evidence-based interventions for health behavior change were utilized today including the discussion of self monitoring techniques, problem-solving barriers and SMART goal setting techniques.   Regarding patient's less desirable eating habits and patterns, we employed the technique of small changes.   Goal: measuring their  intake of lean proteins and veggies    Recommended Physical Activity Goals Kamdon has been advised to work up to 300-450 minutes of moderate intensity aerobic activity a week and strengthening exercises 2-3 times per week for cardiovascular health, weight loss maintenance and preservation of muscle mass.   He has agreed to: continue to gradually increase the amount and intensity of exercise routine   Pharmacotherapy See T2DM note.   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 Pt states he was unaware he had diabetes until today. Most recent A1c was 6.3 on 09/28/2023.   - Counseled patient on pathophysiology of disease and discussed how good blood sugar control is important to decrease the risk of diabetic complications such as nephropathy, neuropathy, limb loss, blindness, coronary artery disease, etc.   - Intensive lifestyle modification including diet, exercise and weight loss are the first line of treatment for diabetes. We extensively discussed the importance of decreasing simple carbs, increasing proteins and how certain foods they eat will affect their blood sugars - Educated pt about the importance of getting yearly DM eye & foot exams and urine microalb: crt ratio test.  - Educated pt on GLP-1 receptor agonists; pt declines Mounjaro today.  - Importance of f/up with PCP was stressed. - Encouraged pt to read more about this condition on the American Diabetes Association website.    Hypertension associated with type 2 diabetes mellitus (HCC) Assessment &  Plan: Last 3 blood pressure readings in our office are as follows: BP Readings from Last 3 Encounters:  10/26/23 126/78  10/12/23 133/75  09/28/23 138/84   The ASCVD Risk score (Arnett DK, et al., 2019) failed to calculate for the following  reasons:   The valid total cholesterol range is 130 to 320 mg/dL  Lab Results  Component Value Date   CREATININE 1.30 (H) 09/28/2023   Pt reports compliance with Amlodipine 10 mg daily and Losartan-hydrochlorothiazide 100/25 mg daily. BP stable today. Pt asx.   - Continue all medications.  - Goal BP: 120/80 or less on a regular basis  - Continue to work on nutrition plan to promote weight loss and improve BP control.     Vitamin D  deficiency Assessment & Plan: Lab Results  Component Value Date   VD25OH 32.6 09/28/2023   Currently on Ergocalciferol  50,000 units twice weekly  without any adverse effects such as nausea, vomiting or muscle weakness. No acute concerns.   - Continue vitamin D  supplementation and weight loss efforts. - Recheck 3 months.     Follow up:   Return 11/17/2023 12:00 PM.  He was informed of the importance of frequent follow up visits to maximize his success with intensive lifestyle modifications for his multiple health conditions.   Subjective:   Chief complaint: Obesity Calloway is here to discuss his progress with his obesity treatment plan. He is on the  Category 3 Meal Plan with B options and only 200 snack calories.   Interval History:  Rolin Schult is here for a follow up office visit. Since last OV on 10/12/2023 , his weight has not changed. States he is following his eating plan approximately 80% of the time, but upon interview he endorses not getting in enough lean protein nor measuring his protein. He states he is walking 30 minutes 3 days per week.   Pharmacotherapy that aid with weight loss: none    Review of Systems:  Pertinent positives were addressed with patient today.  Reviewed by clinician on day of visit: allergies, medications, problem list, medical history, surgical history, family history, social history, and previous encounter notes.  Weight Summary and Biometrics   Weight Lost Since Last Visit: 0lb  Weight Gained Since Last  Visit: 0lb   Vitals Temp: 98.6 F (37 C) BP: 126/78 Pulse Rate: 74 SpO2: 100 %   Anthropometric Measurements Height: 5' 6.5 (1.689 m) Weight: 257 lb (116.6 kg) BMI (Calculated): 40.86 Weight at Last Visit: 257lb Weight Lost Since Last Visit: 0lb Weight Gained Since Last Visit: 0lb Starting Weight: 258lb Total Weight Loss (lbs): 1 lb (0.454 kg) Peak Weight: 260lb Waist Measurement : 51 inches   Body Composition  Body Fat %: 36.4 % Fat Mass (lbs): 93.6 lbs Muscle Mass (lbs): 155.8 lbs Total Body Water (lbs): 121 lbs Visceral Fat Rating : 24   Other Clinical Data RMR: 2146 Fasting: No Labs: no Today's Visit #: 3 Starting Date: 09/28/23 Comments: Cat 3/B/200snack    Objective:   PHYSICAL EXAM: Blood pressure 126/78, pulse 74, temperature 98.6 F (37 C), height 5' 6.5 (1.689 m), weight 257 lb (116.6 kg), SpO2 100%. Body mass index is 40.86 kg/m.  General: he is overweight, cooperative and in no acute distress. PSYCH: Has normal mood, affect and thought process.   HEENT: EOMI, sclerae are anicteric. Lungs: Normal breathing effort, no conversational dyspnea. Extremities: Moves * 4 Neurologic: A and O * 3, good insight  DIAGNOSTIC DATA REVIEWED: BMET  Component Value Date/Time   NA 138 09/28/2023 0945   K 4.0 09/28/2023 0945   CL 100 09/28/2023 0945   CO2 24 09/28/2023 0945   GLUCOSE 103 (H) 09/28/2023 0945   GLUCOSE 144 (H) 07/12/2011 2320   BUN 13 09/28/2023 0945   CREATININE 1.30 (H) 09/28/2023 0945   CALCIUM 9.0 09/28/2023 0945   GFRNONAA 69 (L) 07/12/2011 2303   GFRAA 79 (L) 07/12/2011 2303   Lab Results  Component Value Date   HGBA1C 6.3 (H) 09/28/2023   Lab Results  Component Value Date   INSULIN  50.7 (H) 09/28/2023   Lab Results  Component Value Date   TSH 1.750 09/28/2023   CBC    Component Value Date/Time   WBC 6.6 09/28/2023 0945   WBC 7.8 07/12/2011 2303   RBC 4.58 09/28/2023 0945   RBC 4.85 07/12/2011 2303   HGB 13.7  09/28/2023 0945   HCT 39.8 09/28/2023 0945   PLT 190 09/28/2023 0945   MCV 87 09/28/2023 0945   MCH 29.9 09/28/2023 0945   MCH 28.5 07/12/2011 2303   MCHC 34.4 09/28/2023 0945   MCHC 35.5 07/12/2011 2303   RDW 14.4 09/28/2023 0945   Iron Studies No results found for: IRON, TIBC, FERRITIN, IRONPCTSAT Lipid Panel     Component Value Date/Time   CHOL 126 09/28/2023 0945   TRIG 71 09/28/2023 0945   HDL 47 09/28/2023 0945   CHOLHDL 2.7 09/28/2023 0945   LDLCALC 64 09/28/2023 0945   Hepatic Function Panel     Component Value Date/Time   PROT 6.8 09/28/2023 0945   ALBUMIN 4.0 09/28/2023 0945   AST 18 09/28/2023 0945   ALT 16 09/28/2023 0945   ALKPHOS 80 09/28/2023 0945   BILITOT 0.3 09/28/2023 0945      Component Value Date/Time   TSH 1.750 09/28/2023 0945   Nutritional Lab Results  Component Value Date   VD25OH 32.6 09/28/2023    Attestations:   I, Special Puri, acting as a Stage manager for [employer], DO., have compiled all relevant documentation for today's office visit on behalf of Caleb Jenkins, DO, while in the presence of [employer], DO.  I have reviewed the above documentation for accuracy and completeness, and I agree with the above. Caleb JINNY Waller, D.O.  The 21st Century Cures Act was signed into law in 2016 which includes the topic of electronic health records.  This provides immediate access to information in MyChart.  This includes consultation notes, operative notes, office notes, lab results and pathology reports.  If you have any questions about what you read please let us  know at your next visit so we can discuss your concerns and take corrective action if need be.  We are right here with you.

## 2023-11-16 ENCOUNTER — Ambulatory Visit (INDEPENDENT_AMBULATORY_CARE_PROVIDER_SITE_OTHER): Admitting: Family Medicine

## 2023-11-17 ENCOUNTER — Ambulatory Visit (INDEPENDENT_AMBULATORY_CARE_PROVIDER_SITE_OTHER): Admitting: Family Medicine

## 2023-11-17 ENCOUNTER — Encounter (INDEPENDENT_AMBULATORY_CARE_PROVIDER_SITE_OTHER): Payer: Self-pay | Admitting: Family Medicine

## 2023-11-17 DIAGNOSIS — E559 Vitamin D deficiency, unspecified: Secondary | ICD-10-CM | POA: Diagnosis not present

## 2023-11-17 DIAGNOSIS — E1159 Type 2 diabetes mellitus with other circulatory complications: Secondary | ICD-10-CM | POA: Diagnosis not present

## 2023-11-17 DIAGNOSIS — E538 Deficiency of other specified B group vitamins: Secondary | ICD-10-CM

## 2023-11-17 DIAGNOSIS — E1169 Type 2 diabetes mellitus with other specified complication: Secondary | ICD-10-CM

## 2023-11-17 DIAGNOSIS — Z7985 Long-term (current) use of injectable non-insulin antidiabetic drugs: Secondary | ICD-10-CM

## 2023-11-17 DIAGNOSIS — I152 Hypertension secondary to endocrine disorders: Secondary | ICD-10-CM

## 2023-11-17 DIAGNOSIS — Z6841 Body Mass Index (BMI) 40.0 and over, adult: Secondary | ICD-10-CM

## 2023-11-17 MED ORDER — CYANOCOBALAMIN 500 MCG PO TABS: 500.0000 ug | ORAL_TABLET | Freq: Every day | ORAL | Status: AC

## 2023-11-17 MED ORDER — VITAMIN D (ERGOCALCIFEROL) 1.25 MG (50000 UNIT) PO CAPS: ORAL_CAPSULE | ORAL | Status: AC

## 2023-11-17 NOTE — Progress Notes (Signed)
 Caleb Waller, D.O.  ABFM, ABOM Specializing in Clinical Bariatric Medicine  Office located at: [address] [place], [place]  [address]     Assessment and Plan:   Medications Discontinued During This Encounter  Medication Reason   cyanocobalamin  (VITAMIN B12) 500 MCG tablet Reorder   Vitamin D , Ergocalciferol , (DRISDOL ) 1.25 MG (50000 UNIT) CAPS capsule Reorder    Meds ordered this encounter  Medications   cyanocobalamin  (VITAMIN B12) 500 MCG tablet    Sig: Take 1 tablet (500 mcg total) by mouth daily.   Vitamin D , Ergocalciferol , (DRISDOL ) 1.25 MG (50000 UNIT) CAPS capsule    Sig: Every Mon and th      FOR THE DISEASE OF OBESITY:  Morbid obesity Starting (HCC) 41.0 BMI 40.0-44.9, adult (HCC) current 41.18 Assessment & Plan: Since last office visit on 10/26/23 patient's muscle mass has increased by 2.8 lbs. Fat mass has decreased by 1 lb. Total body water has increased by 2.2lbs.  Body fat % has decreased by 0.7 %. Counseling done on how various foods will affect these numbers and how to maximize success  Total lbs lost to date: +1 lbs Total weight loss percentage to date: +0.39 %   Recommended Dietary Goals [name] is currently in the action stage of change. As such, his goal is to continue weight management plan.  He has agreed to: continue current plan   Behavioral Intervention We discussed the following today: increasing lean protein intake to established goals and decreasing simple carbohydrates and added sugars, eat more whole foods, eat more protein to curb hunger/cravings, reviewed the role of protein as it relates to weight loss and how it effects metabolism, planning for success by becoming more consistent with eating a healthy diet, not skipping meals, exercising, and meeting recommended hours of sleep per night. Discussed healthier alternatives for dressing and dips (Skinny Girl and [employer] sugar free BBQ sauce).   Additional resources provided  today: None  Evidence-based interventions for health behavior change were utilized today including the discussion of self monitoring techniques, problem-solving barriers and SMART goal setting techniques.   Regarding patient's less desirable eating habits and patterns, we employed the technique of small changes.   Pt will specifically work on: Occasionally measure after cooking weight of proteins and eat all his must-eats   Recommended Physical Activity Goals [name] has been advised to work up to 300-450 minutes of moderate intensity aerobic activity a week and strengthening exercises 2-3 times per week for cardiovascular health, weight loss maintenance and preservation of muscle mass.   He has agreed to: Continue current level of physical activity  and Increase physical activity in their day and reduce sedentary time (increase NEAT).   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 He has since scheduled an appt with his PCP to discuss this new diagnosis.  Emphasized importance of improving glycemic control to reduce risk of complications and prevent worsening of comorbid conditions associated with poorly controlled diabetes. Discussed starting Mounjaro for management of diabetes. Pt wishes to first contact insurance to discuss potential coverage/cost. Reviewed risks/benefits. Will consider starting in the future. Continue working on healthy diet per his  meal plan and regularly exercising.     Vitamin D  deficiency Assessment & Plan: Lab Results  Component Value Date   VD25OH 32.6 09/28/2023   Currently taking ERGO 50K units twice weekly;  Mon/Thurs. Pt now compliant with supplementation, previously was skipping doses. Tolerating well with no SE or acute concerns.   Vit D goal of 50-70. Continue current supplementation regimen and working on adherence as prescribed. Will continue monitoring.     Hypertension associated with type 2 diabetes mellitus (HCC) Assessment & Plan: BP Readings from Last 3 Encounters:  11/17/23 136/76  10/26/23 126/78  10/12/23 133/75   BP at high normal range. Currently compliant with Amlodipine 10 mg once daily and Hyzaar 100-25 mg once daily. Tolerating well with no adverse SE reported. Monitors his BP at home with avg readings in the 135s/80s.   Continue with current antihypertensive regimen. Recommend pt continue BP monitoring at home. Work on following a heart-healthy diet per his meal plan; increase protein, decrease simple carbs, and reduce salt intake. Continue regular exercise. Will continue monitoring alongside PCP.    B12 deficiency - new onset Assessment & Plan: Lab Results  Component Value Date   VITAMINB12 311 09/28/2023   Pt is taking B12 with good compliance and tolerance. No acute concerns reported.   Goal B12 of 500+ reviewed with pt. Continue with current supplementation regimen. Will continue monitoring.     Follow up:   Return for f/u on 12/08/2023 at 10:00 AM. He was informed of the importance of frequent follow up visits to maximize his success with intensive lifestyle modifications for his multiple health conditions.  Subjective:   Chief complaint: Obesity Caleb Waller is here to discuss his progress with his obesity treatment plan. He is on the Category 3 Plan w Breakfast options and only 200 snack calories and states he is following his eating plan approximately 75% of the time. He states he is walking 15 minutes 3 days per week.    Interval History:  Caleb Waller is here for a follow up office visit. Since last OV on 10/26/23, he is up 2 lbs. Purposefully tried to eat  more protein. He has been doing well with following his meal plan for breakfast, but struggles with lunch. Yesterday he started eating [place] sandwich with [name] 45 cal bread  with lettuce, tomato, and spiced mustard. For dinner, he struggles the most with this meal prepping dinner to take to work with him (works night shifts). He notes his wife cooks dinner, he just forgets to pack it. When he forgets dinner, he tends to gets baked or grilled proteins and a salad with [facility] dressing from [employer] near his workplace. He is sleeping 7-9 hours/night.    Pharmacotherapy that aid with weight loss: He is currently taking no anti-obesity medication.    Review of Systems:  Pertinent positives were addressed with patient today.  Reviewed by clinician on day of visit: allergies, medications, problem list, medical history, surgical history, family history, social history, and previous encounter notes.  Weight Summary and Biometrics   Weight Lost Since Last Visit: 0  Weight Gained Since Last Visit: 2lb    Vitals Temp: 98.5 F (36.9 C) BP: 136/76 Pulse Rate: 75 SpO2: 96 %   Anthropometric Measurements Height: 5' 6.5 (1.689 m) Weight: 259 lb (117.5 kg) BMI (Calculated): 41.18 Weight at Last Visit: 257lb Weight Lost Since Last Visit: 0 Weight Gained Since Last Visit: 2lb Starting Weight: 258lb Total Weight Loss (lbs): 0 lb (  0 kg) Peak Weight: 260lb Waist Measurement : 51 inches   Body Composition  Body Fat %: 35.7 % Fat Mass (lbs): 92.6 lbs Muscle Mass (lbs): 158.6 lbs Total Body Water (lbs): 123.2 lbs Visceral Fat Rating : 23   Other Clinical Data Fasting: no Labs: no Today's Visit #: 4 Starting Date: 09/28/23    Objective:   PHYSICAL EXAM: Blood pressure 136/76, pulse 75, temperature 98.5 F (36.9 C), height 5' 6.5 (1.689 m), weight 259 lb (117.5 kg), SpO2 96%. Body mass index is 41.18 kg/m.  General: he is overweight, cooperative and in no acute  distress. PSYCH: Has normal mood, affect and thought process.   HEENT: EOMI, sclerae are anicteric. Lungs: Normal breathing effort, no conversational dyspnea. Extremities: Moves * 4 Neurologic: A and O * 3, good insight  DIAGNOSTIC DATA REVIEWED: BMET    Component Value Date/Time   NA 138 09/28/2023 0945   K 4.0 09/28/2023 0945   CL 100 09/28/2023 0945   CO2 24 09/28/2023 0945   GLUCOSE 103 (H) 09/28/2023 0945   GLUCOSE 144 (H) 07/12/2011 2320   BUN 13 09/28/2023 0945   CREATININE 1.30 (H) 09/28/2023 0945   CALCIUM 9.0 09/28/2023 0945   GFRNONAA 69 (L) 07/12/2011 2303   GFRAA 79 (L) 07/12/2011 2303   Lab Results  Component Value Date   HGBA1C 6.3 (H) 09/28/2023   Lab Results  Component Value Date   INSULIN  50.7 (H) 09/28/2023   Lab Results  Component Value Date   TSH 1.750 09/28/2023   CBC    Component Value Date/Time   WBC 6.6 09/28/2023 0945   WBC 7.8 07/12/2011 2303   RBC 4.58 09/28/2023 0945   RBC 4.85 07/12/2011 2303   HGB 13.7 09/28/2023 0945   HCT 39.8 09/28/2023 0945   PLT 190 09/28/2023 0945   MCV 87 09/28/2023 0945   MCH 29.9 09/28/2023 0945   MCH 28.5 07/12/2011 2303   MCHC 34.4 09/28/2023 0945   MCHC 35.5 07/12/2011 2303   RDW 14.4 09/28/2023 0945   Iron Studies No results found for: IRON, TIBC, FERRITIN, IRONPCTSAT Lipid Panel     Component Value Date/Time   CHOL 126 09/28/2023 0945   TRIG 71 09/28/2023 0945   HDL 47 09/28/2023 0945   CHOLHDL 2.7 09/28/2023 0945   LDLCALC 64 09/28/2023 0945   Hepatic Function Panel     Component Value Date/Time   PROT 6.8 09/28/2023 0945   ALBUMIN 4.0 09/28/2023 0945   AST 18 09/28/2023 0945   ALT 16 09/28/2023 0945   ALKPHOS 80 09/28/2023 0945   BILITOT 0.3 09/28/2023 0945      Component Value Date/Time   TSH 1.750 09/28/2023 0945   Nutritional Lab Results  Component Value Date   VD25OH 32.6 09/28/2023    Attestations:   I, Vernell Forest, acting as a medical scribe for Caleb Jenkins, DO., have compiled all relevant documentation for today's office visit on behalf of Caleb Jenkins, DO, while in the presence of [employer], DO.  I have reviewed the above documentation for accuracy and completeness, and I agree with the above. Caleb Waller, D.O.  The 21st Century Cures Act was signed into law in 2016 which includes the topic of electronic health records.  This provides immediate access to information in MyChart.  This includes consultation notes, operative notes, office notes, lab results and pathology reports.  If you have any questions about what you read please let us  know at your next visit  so we can discuss your concerns and take corrective action if need be.  We are right here with you.

## 2023-12-08 ENCOUNTER — Ambulatory Visit (INDEPENDENT_AMBULATORY_CARE_PROVIDER_SITE_OTHER): Admitting: Family Medicine

## 2023-12-22 ENCOUNTER — Ambulatory Visit (INDEPENDENT_AMBULATORY_CARE_PROVIDER_SITE_OTHER): Admitting: Family Medicine

## 2024-01-12 ENCOUNTER — Encounter (INDEPENDENT_AMBULATORY_CARE_PROVIDER_SITE_OTHER): Payer: Self-pay
